# Patient Record
Sex: Female | Born: 1980 | Race: Black or African American | Hispanic: No | Marital: Single | Smoking: Current every day smoker
Health system: Southern US, Community
[De-identification: ages and names within clinical notes are randomized; demographics above are authoritative.]

## PROBLEM LIST (undated history)

## (undated) DIAGNOSIS — I10 Essential (primary) hypertension: Secondary | ICD-10-CM

## (undated) HISTORY — PX: OTHER SURGICAL HISTORY: SHX169

## (undated) HISTORY — DX: Essential (primary) hypertension: I10

---

## 2009-08-13 ENCOUNTER — Ambulatory Visit: Payer: Self-pay | Admitting: Family Medicine

## 2009-08-13 DIAGNOSIS — I10 Essential (primary) hypertension: Secondary | ICD-10-CM

## 2009-09-28 ENCOUNTER — Ambulatory Visit: Payer: Self-pay | Admitting: Family Medicine

## 2009-09-28 DIAGNOSIS — R000 Tachycardia, unspecified: Secondary | ICD-10-CM

## 2009-10-01 ENCOUNTER — Encounter: Payer: Self-pay | Admitting: Family Medicine

## 2009-10-05 LAB — CONVERTED CEMR LAB
ALT: 11 units/L (ref 0–35)
Albumin: 4.1 g/dL (ref 3.5–5.2)
CO2: 23 meq/L (ref 19–32)
Chloride: 105 meq/L (ref 96–112)
Cholesterol: 147 mg/dL (ref 0–200)
Potassium: 3.4 meq/L — ABNORMAL LOW (ref 3.5–5.3)
Sodium: 141 meq/L (ref 135–145)
Total Bilirubin: 0.6 mg/dL (ref 0.3–1.2)
Total Protein: 7.1 g/dL (ref 6.0–8.3)
VLDL: 19 mg/dL (ref 0–40)

## 2009-10-08 ENCOUNTER — Ambulatory Visit: Payer: Self-pay | Admitting: Family Medicine

## 2009-10-08 DIAGNOSIS — K5909 Other constipation: Secondary | ICD-10-CM | POA: Insufficient documentation

## 2009-10-08 DIAGNOSIS — K644 Residual hemorrhoidal skin tags: Secondary | ICD-10-CM | POA: Insufficient documentation

## 2010-01-28 ENCOUNTER — Ambulatory Visit: Payer: Self-pay | Admitting: Family Medicine

## 2010-08-13 ENCOUNTER — Ambulatory Visit: Payer: Self-pay | Admitting: Family Medicine

## 2010-08-13 DIAGNOSIS — J069 Acute upper respiratory infection, unspecified: Secondary | ICD-10-CM | POA: Insufficient documentation

## 2010-11-09 NOTE — Assessment & Plan Note (Signed)
Summary: f/u HTN   Vital Signs:  Patient profile:   30 year old female Height:      65 inches Weight:      147 pounds BMI:     24.55 O2 Sat:      100 % on Room air Pulse rate:   87 / minute BP sitting:   118 / 77  (left arm) Cuff size:   regular  Vitals Entered By: Payton Spark CMA (January 28, 2010 3:09 PM)  O2 Flow:  Room air CC: F/U BP. Doing well. , Hypertension Management   CC:  F/U BP. Doing well.  and Hypertension Management.  Hypertension History:      She denies headache, chest pain, palpitations, dyspnea with exertion, orthopnea, PND, peripheral edema, visual symptoms, neurologic problems, syncope, and side effects from treatment.  She notes no problems with any antihypertensive medication side effects.  Doing well on HCTZ 12.5mg / day.  Constipation has improved.  Marland Kitchen        Positive major cardiovascular risk factors include hypertension and current tobacco user.  Negative major cardiovascular risk factors include female age less than 64 years old, no history of diabetes or hyperlipidemia, and negative family history for ischemic heart disease.        Further assessment for target organ damage reveals no history of ASHD, cardiac end-organ damage (CHF/LVH), stroke/TIA, peripheral vascular disease, renal insufficiency, or hypertensive retinopathy.     Habits & Providers  Alcohol-Tobacco-Diet     Tobacco Status: current  Current Medications (verified): 1)  Depo-Provera 150 Mg/ml Susp (Medroxyprogesterone Acetate) .... Inj Q 3 Months. 2)  Hydrochlorothiazide 12.5 Mg Caps (Hydrochlorothiazide) .Marland Kitchen.. 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 Lives with 36 yo daughter.  Her mom helps out. Smokes 1 ppd x 4 yrs. Denies  ETOH. Walks 30 min daily. Not currently sexually active -- on Depo Provera.    Social History: Reviewed history from 08/13/2009 and no changes required. 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 Lives with 41 yo daughter.  Her mom helps out. Smokes 1 ppd x 4 yrs. Denies ETOH. Walks 30 min daily. Not currently sexually active -- on Depo Provera.  Smoking Status:  current  Review of Systems      See HPI  Physical Exam  General:  alert, well-developed, well-nourished, and well-hydrated.   Head:  normocephalic and atraumatic.   Eyes:  pupils equal, pupils round, and pupils reactive to light.  wears glasses Nose:  no nasal discharge.   Mouth:  good dentition and pharynx pink and moist.   Neck:  no masses.   Lungs:  Normal respiratory effort, chest expands symmetrically. Lungs are clear to auscultation, no crackles or wheezes. Heart:  Normal rate and regular rhythm. S1 and S2 normal without gallop, murmur, click, rub or other extra sounds. Extremities:  no LE edema Skin:  color normal.   Cervical Nodes:  No lymphadenopathy noted Psych:  good eye contact, not anxious appearing, and not depressed appearing.     Impression & Recommendations:  Problem # 1:  ESSENTIAL HYPERTENSION, BENIGN (ICD-401.1) BP looks great.  Continue current meds.  RFd.  Labs are UTD.  Her updated medication list for this problem includes:    Hydrochlorothiazide 12.5 Mg Caps (Hydrochlorothiazide) .Marland KitchenMarland KitchenMarland KitchenMarland Kitchen  1 capsule by mouth daily  BP today: 118/77 Prior BP: 93/63 (10/08/2009)  10 Yr Risk Heart Disease: 1 %  Labs Reviewed: K+: 3.4 (10/01/2009) Creat: : 1.10 (10/01/2009)   Chol: 147 (10/01/2009)   HDL: 43 (10/01/2009)   LDL: 85 (10/01/2009)   TG: 96 (10/01/2009)  Problem # 2:  CONSTIPATION, CHRONIC (ICD-564.09) Assessment: Improved Improved and hemorrhoid has also improved.  Continue maintenance with high fiber diet, plenty of water, exercise and stool softeners.   Complete Medication  List: 1)  Depo-provera 150 Mg/ml Susp (Medroxyprogesterone acetate) .... Inj q 3 months. 2)  Hydrochlorothiazide 12.5 Mg Caps (Hydrochlorothiazide) .Marland Kitchen.. 1 capsule by mouth daily  Hypertension Assessment/Plan:      The patient's hypertensive risk group is category B: At least one risk factor (excluding diabetes) with no target organ damage.  Her calculated 10 year risk of coronary heart disease is 1 %.  Today's blood pressure is 118/77.  Her blood pressure goal is < 140/90.  Patient Instructions: 1)  HCTZ RFd. 2)  BP is perfect. 3)  High fiber diet, plenty of water, exercise and OTC COLACE (stool softener). Prescriptions: HYDROCHLOROTHIAZIDE 12.5 MG CAPS (HYDROCHLOROTHIAZIDE) 1 capsule by mouth daily  #90 x 1   Entered and Authorized by:   Seymour Bars DO   Signed by:   Seymour Bars DO on 01/28/2010   Method used:   Electronically to        [facility] [phone]* (retail)       [address] [place], [place]  [address]       Ph: [phone] or [phone]       Fax: [phone]   RxID:   [id number]

## 2010-11-09 NOTE — Assessment & Plan Note (Signed)
Summary: cold/ HTN   Vital Signs:  Patient profile:   30 year old female Height:      65 inches Weight:      146 pounds BMI:     24.38 O2 Sat:      100 % on Room air Pulse rate:   97 / minute BP sitting:   119 / 71  (left arm) Cuff size:   regular  Vitals Entered By: Payton Spark CMA (August 13, 2010 1:37 PM)  O2 Flow:  Room air CC: F/U HTN. Also c/o cough and cold x 1 week.    Primary Care Provider:  Seymour Bars DO  CC:  F/U HTN. Also c/o cough and cold x 1 week. Marland Kitchen  History of Present Illness: 30 yo AAF presents for a cold that started 8 days ago.  She has a lingering dry cough.  Able to sleep okay.  using Tylenol cold and cough drops.  Sleeping ok.  She has not had asthma problems.  She has felt chest tightness and wheezing.  She is a smoker.  Doing well on HCTZ for HTN.    Current Medications (verified): 1)  Depo-Provera 150 Mg/ml Susp (Medroxyprogesterone Acetate) .... Inj Q 3 Months. 2)  Hydrochlorothiazide 12.5 Mg Caps (Hydrochlorothiazide) .Marland Kitchen.. 1 Capsule By Mouth Daily  Allergies (verified): No Known Drug Allergies  Past History:  Past Medical History: [id number] HTN  gyn: Dr Lily Peer  Social History: Reviewed history from 08/13/2009 and no changes required. 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  Her mom helps out. Smokes 1 ppd x 4 yrs. Denies ETOH. Walks 30 min daily. Not currently sexually active -- on Depo Provera.    Review of Systems      See HPI  Physical Exam  General:  alert, well-developed, well-nourished, and well-hydrated.   Head:  normocephalic and atraumatic.  sinuses NTTP Eyes:  conjunctiva clear Ears:  EACs patent; TMs translucent and gray with good cone of light and bony landmarks.  Nose:  no nasal discharge.   Mouth:  good dentition and pharynx pink and moist.   Neck:  no masses.   Lungs:  Normal respiratory effort, chest expands symmetrically. Lungs are clear to auscultation,  no crackles or wheezes. Heart:  Normal rate and regular rhythm. S1 and S2 normal without gallop, murmur, click, rub or other extra sounds. Skin:  color normal and no rashes.   Cervical Nodes:  No lymphadenopathy noted   Impression & Recommendations:  Problem # 1:  ESSENTIAL HYPERTENSION, BENIGN (ICD-401.1) Doing well.  Stay on current meds.  Labs UTD.  RTC in 6 mos. Her updated medication list for this problem includes:    Hydrochlorothiazide 12.5 Mg Caps (Hydrochlorothiazide) .Marland Kitchen... 1 capsule by mouth daily  BP today: 119/71 Prior BP: 118/77 (01/28/2010)  Prior 10 Yr Risk Heart Disease: 1 % (01/28/2010)  Labs Reviewed: K+: 3.4 (10/01/2009) Creat: : 1.10 (10/01/2009)   Chol: 147 (10/01/2009)   HDL: 43 (10/01/2009)   LDL: 85 (10/01/2009)   TG: 96 (10/01/2009)  Problem # 2:  VIRAL URI (ICD-465.9)  Her updated medication list for this problem includes:    Benzonatate 200 Mg Caps (Benzonatate) .Marland Kitchen... 1 capsule by mouth three times a day as needed cough  Instructed on symptomatic treatment. Call if symptoms persist or worsen.   Complete Medication List: 1)  Depo-provera 150 Mg/ml Susp (Medroxyprogesterone acetate) .... Inj q 3 months. 2)  Hydrochlorothiazide 12.5 Mg Caps (Hydrochlorothiazide) .Marland KitchenMarland KitchenMarland Kitchen  1 capsule by mouth daily 3)  Benzonatate 200 Mg Caps (Benzonatate) .Marland Kitchen.. 1 capsule by mouth three times a day as needed cough  Patient Instructions: 1)  Use Benzonate as needed for cough. 2)  Expect improvements in the next 7 days. 3)  Call if any problems. 4)  Avoid smoking. 5)  BP looks great! 6)  Stay on HCTZ daily. 7)  Return for f/u BP with fasting labs in 6 mos. Prescriptions: BENZONATATE 200 MG CAPS (BENZONATATE) 1 capsule by mouth three times a day as needed cough  #24 x 0   Entered and Authorized by:   Seymour Bars DO   Signed by:   Seymour Bars DO on 08/13/2010   Method used:   [facility]ronically to        [facility] [phone]* (retail)       [address] [place], [place]  [address]       Ph: [phone] or [phone]       Fax: [phone]   RxID:   [id number]    Orders Added: 1)  Est. Patient Level III [36644]

## 2011-01-10 ENCOUNTER — Other Ambulatory Visit: Payer: Self-pay | Admitting: Family Medicine

## 2011-01-11 ENCOUNTER — Other Ambulatory Visit: Payer: Self-pay | Admitting: *Deleted

## 2011-01-11 DIAGNOSIS — I10 Essential (primary) hypertension: Secondary | ICD-10-CM

## 2011-01-11 MED ORDER — HYDROCHLOROTHIAZIDE 12.5 MG PO CAPS: 12.5000 mg | ORAL_CAPSULE | Freq: Every day | ORAL | Status: DC

## 2011-02-03 ENCOUNTER — Ambulatory Visit (INDEPENDENT_AMBULATORY_CARE_PROVIDER_SITE_OTHER): Payer: BC Managed Care – PPO | Admitting: Family Medicine

## 2011-02-03 ENCOUNTER — Encounter: Payer: Self-pay | Admitting: Family Medicine

## 2011-02-03 VITALS — BP 121/86 | HR 101 | Temp 98.6°F | Ht 64.0 in | Wt 151.0 lb

## 2011-02-03 DIAGNOSIS — J329 Chronic sinusitis, unspecified: Secondary | ICD-10-CM

## 2011-02-03 MED ORDER — AMOXICILLIN-POT CLAVULANATE 875-125 MG PO TABS: 1.0000 | ORAL_TABLET | Freq: Two times a day (BID) | ORAL | Status: AC

## 2011-02-03 NOTE — Progress Notes (Signed)
  Subjective:    Patient ID: Brianna Mendoza, female    DOB: 31-Mar-1981, 31 y.o.   MRN: [id number]  HPI  30 yo AAF presents for for sinus congestion x 3 days.  She had dental surgery last wk.  She if feeling bad.  She has a lot of rhinorrhea.  No known allergies.  She does sneeze a lot.  She has a lot of sinus pain/ pressure.  No fevers or chills.  She is taking Amoxicillin (on for the past 7 days) for recent wisdom teeth removal.    BP 121/86  Pulse 101  Temp(Src) 98.6 F (37 C) (Oral)  Ht 5\' 4"  (1.626 m)  Wt 151 lb (68.493 kg)  BMI 25.92 kg/m2  SpO2 98%      Review of Systems as per HPI    Objective:   Physical Exam  Constitutional: She appears well-developed and well-nourished. No distress.       Maxillary sinuses TTP  HENT:  Head: Normocephalic and atraumatic.  Right Ear: There is tenderness. Tympanic membrane is not injected and not erythematous. A middle ear effusion is present.  Left Ear: There is tenderness. Tympanic membrane is not injected and not erythematous. A middle ear effusion is present.  Mouth/Throat: No oropharyngeal exudate.  Eyes: Conjunctivae are normal.  Cardiovascular: Normal rate, regular rhythm and normal heart sounds.   Pulmonary/Chest: Effort normal and breath sounds normal. No respiratory distress.  Lymphadenopathy:    She has cervical adenopathy.  Skin: Skin is warm and dry.  Psychiatric: She has a normal mood and affect.          Assessment & Plan:  1> sinusitis - Amox for dental work does not seem to be helping.  Changed her to Augmentin (can stop the Amox) this will also cover for dental issues.  She can use Advil cold and sinus for congestion/ ear effusions.  OIut of work note given for today.  Rest, clear fluids and call if not resolved in 10 days.

## 2011-02-03 NOTE — Patient Instructions (Signed)
Trade Amoxicillin for Augmentin and take with breakfast and dinner for the next 7 days. Take Advil Cold and Sinus for symptom relief and try to rest and hydrate.  Call if sinusitis has not resolved in 7 days.  F/U with your oral surgeon also.

## 2011-02-11 ENCOUNTER — Ambulatory Visit: Payer: Self-pay | Admitting: Family Medicine

## 2011-03-19 ENCOUNTER — Other Ambulatory Visit: Payer: Self-pay | Admitting: Family Medicine

## 2011-07-25 ENCOUNTER — Other Ambulatory Visit: Payer: Self-pay | Admitting: *Deleted

## 2011-07-25 DIAGNOSIS — I10 Essential (primary) hypertension: Secondary | ICD-10-CM

## 2011-07-25 MED ORDER — HYDROCHLOROTHIAZIDE 12.5 MG PO CAPS: 12.5000 mg | ORAL_CAPSULE | Freq: Every day | ORAL | Status: DC

## 2011-08-07 ENCOUNTER — Encounter: Payer: Self-pay | Admitting: Family Medicine

## 2011-08-12 ENCOUNTER — Ambulatory Visit: Payer: BC Managed Care – PPO | Admitting: Family Medicine

## 2011-08-12 DIAGNOSIS — Z0289 Encounter for other administrative examinations: Secondary | ICD-10-CM

## 2011-09-26 ENCOUNTER — Other Ambulatory Visit: Payer: Self-pay | Admitting: *Deleted

## 2011-09-26 DIAGNOSIS — I10 Essential (primary) hypertension: Secondary | ICD-10-CM

## 2011-09-26 MED ORDER — HYDROCHLOROTHIAZIDE 12.5 MG PO CAPS: 12.5000 mg | ORAL_CAPSULE | Freq: Every day | ORAL | Status: DC

## 2011-11-25 ENCOUNTER — Other Ambulatory Visit: Payer: Self-pay | Admitting: Family Medicine

## 2011-11-28 NOTE — Telephone Encounter (Signed)
Must make appointment 

## 2011-12-19 DIAGNOSIS — R102 Pelvic and perineal pain unspecified side: Secondary | ICD-10-CM | POA: Insufficient documentation

## 2011-12-19 DIAGNOSIS — N9489 Other specified conditions associated with female genital organs and menstrual cycle: Secondary | ICD-10-CM | POA: Insufficient documentation

## 2011-12-31 ENCOUNTER — Other Ambulatory Visit: Payer: Self-pay | Admitting: Family Medicine

## 2012-01-02 NOTE — Telephone Encounter (Signed)
Must make appointment 

## 2012-01-30 ENCOUNTER — Other Ambulatory Visit: Payer: Self-pay | Admitting: Family Medicine

## 2012-03-01 ENCOUNTER — Other Ambulatory Visit: Payer: Self-pay | Admitting: Family Medicine

## 2012-04-05 ENCOUNTER — Other Ambulatory Visit: Payer: Self-pay | Admitting: Family Medicine

## 2012-04-06 ENCOUNTER — Other Ambulatory Visit: Payer: Self-pay | Admitting: Family Medicine

## 2012-04-11 ENCOUNTER — Encounter: Payer: Self-pay | Admitting: Family Medicine

## 2012-04-11 ENCOUNTER — Ambulatory Visit (INDEPENDENT_AMBULATORY_CARE_PROVIDER_SITE_OTHER): Payer: BC Managed Care – PPO | Admitting: Family Medicine

## 2012-04-11 VITALS — BP 123/81 | HR 85 | Ht 64.0 in | Wt 153.0 lb

## 2012-04-11 DIAGNOSIS — I10 Essential (primary) hypertension: Secondary | ICD-10-CM

## 2012-04-11 LAB — LIPID PANEL
HDL: 39 mg/dL — ABNORMAL LOW (ref >39)
LDL Cholesterol: 84 mg/dL (ref 0–99)
Triglycerides: 64 mg/dL (ref <150)
VLDL: 13 mg/dL (ref 0–40)

## 2012-04-11 MED ORDER — HYDROCHLOROTHIAZIDE 12.5 MG PO CAPS: 12.5000 mg | ORAL_CAPSULE | Freq: Every day | ORAL | Status: DC

## 2012-04-11 NOTE — Progress Notes (Signed)
  Subjective:    Patient ID: Brianna Mendoza, female    DOB: 1981-02-15, 31 y.o.   MRN: [id number]  HPI HTN- No CP or SOB.  Has been exercising more and eating healthy.  Says would like to be able to get off BP meds.  Says plans on getting her own machine.  Eating low salt chips.  Tolerating meds well.    Review of Systems     Objective:   Physical Exam  Constitutional: She is oriented to person, place, and time. She appears well-developed and well-nourished.  HENT:  Head: Normocephalic and atraumatic.  Cardiovascular: Normal rate, regular rhythm and normal heart sounds.   Pulmonary/Chest: Effort normal and breath sounds normal.  Neurological: She is alert and oriented to person, place, and time.  Skin: Skin is warm and dry.  Psychiatric: She has a normal mood and affect. Her behavior is normal.          Assessment & Plan:  HTN - Well controlled. F/U in 6 months Due for CMP and Lipids. She will go today.

## 2012-04-12 LAB — COMPLETE METABOLIC PANEL WITH GFR
ALT: 11 U/L (ref 0–35)
AST: 14 U/L (ref 0–37)
CO2: 29 mEq/L (ref 19–32)
Calcium: 9.4 mg/dL (ref 8.4–10.5)
Chloride: 103 mEq/L (ref 96–112)
Creat: 1.09 mg/dL (ref 0.50–1.10)
GFR, Est African American: 78 mL/min
Sodium: 141 mEq/L (ref 135–145)
Total Protein: 6.8 g/dL (ref 6.0–8.3)

## 2012-09-14 ENCOUNTER — Ambulatory Visit: Payer: BC Managed Care – PPO | Admitting: Family Medicine

## 2012-11-23 ENCOUNTER — Encounter: Payer: Self-pay | Admitting: Physician Assistant

## 2012-11-23 ENCOUNTER — Ambulatory Visit (INDEPENDENT_AMBULATORY_CARE_PROVIDER_SITE_OTHER): Payer: Self-pay

## 2012-11-23 ENCOUNTER — Ambulatory Visit (INDEPENDENT_AMBULATORY_CARE_PROVIDER_SITE_OTHER): Payer: Self-pay | Admitting: Physician Assistant

## 2012-11-23 VITALS — BP 131/85 | HR 76 | Wt 155.0 lb

## 2012-11-23 DIAGNOSIS — M545 Low back pain: Secondary | ICD-10-CM

## 2012-11-23 DIAGNOSIS — Z9181 History of falling: Secondary | ICD-10-CM

## 2012-11-23 DIAGNOSIS — W19XXXA Unspecified fall, initial encounter: Secondary | ICD-10-CM

## 2012-11-23 DIAGNOSIS — M25551 Pain in right hip: Secondary | ICD-10-CM

## 2012-11-23 DIAGNOSIS — M25559 Pain in unspecified hip: Secondary | ICD-10-CM

## 2012-11-23 DIAGNOSIS — W010XXA Fall on same level from slipping, tripping and stumbling without subsequent striking against object, initial encounter: Secondary | ICD-10-CM

## 2012-11-23 MED ORDER — HYDROCODONE-ACETAMINOPHEN 5-325 MG PO TABS: 1.0000 | ORAL_TABLET | Freq: Four times a day (QID) | ORAL | Status: DC | PRN

## 2012-11-23 MED ORDER — CYCLOBENZAPRINE HCL 10 MG PO TABS: 10.0000 mg | ORAL_TABLET | Freq: Every evening | ORAL | Status: DC | PRN

## 2012-11-23 NOTE — Patient Instructions (Addendum)
Can take up to 800mg  of ibuprofen up to three times a day if needed.   RICE: Routine Care for Injuries The routine care of many injuries includes Rest, Ice, Compression, and Elevation (RICE). HOME CARE INSTRUCTIONS  Rest is needed to allow your body to heal. Routine activities can usually be resumed when comfortable. Injured tendons and bones can take up to 6 weeks to heal. Tendons are the cord-like structures that attach muscle to bone.  Ice following an injury helps keep the swelling down and reduces pain.  Put ice in a plastic bag.  Place a towel between your skin and the bag.  Leave the ice on for 15 to 20 minutes, 3 to 4 times a day. Do this while awake, for the first 24 to 48 hours. After that, continue as directed by your caregiver.  Compression helps keep swelling down. It also gives support and helps with discomfort. If an elastic bandage has been applied, it should be removed and reapplied every 3 to 4 hours. It should not be applied tightly, but firmly enough to keep swelling down. Watch fingers or toes for swelling, bluish discoloration, coldness, numbness, or excessive pain. If any of these problems occur, remove the bandage and reapply loosely. Contact your caregiver if these problems continue.  Elevation helps reduce swelling and decreases pain. With extremities, such as the arms, hands, legs, and feet, the injured area should be placed near or above the level of the heart, if possible. SEEK IMMEDIATE MEDICAL CARE IF:  You have persistent pain and swelling.  You develop redness, numbness, or unexpected weakness.  Your symptoms are getting worse rather than improving after several days. These symptoms may indicate that further evaluation or further X-rays are needed. Sometimes, X-rays may not show a small broken bone (fracture) until 1 week or 10 days later. Make a follow-up appointment with your caregiver. Ask when your X-ray results will be ready. Make sure you get your  X-ray results. Document Released: 01/08/2001 Document Revised: 12/19/2011 Document Reviewed: 02/25/2011 [facility] Patient Information 2013 [place], [place]. Low Back Sprain with Rehab  A sprain is an injury in which a ligament is torn. The ligaments of the lower back are vulnerable to sprains. However, they are strong and require great force to be injured. These ligaments are important for stabilizing the spinal column. Sprains are classified into three categories. Grade 1 sprains cause pain, but the tendon is not lengthened. Grade 2 sprains include a lengthened ligament, due to the ligament being stretched or partially ruptured. With grade 2 sprains there is still function, although the function may be decreased. Grade 3 sprains involve a complete tear of the tendon or muscle, and function is usually impaired. SYMPTOMS   Severe pain in the lower back.  Sometimes, a feeling of a "pop," "snap," or tear, at the time of injury.  Tenderness and sometimes swelling at the injury site.  Uncommonly, bruising (contusion) within 48 hours of injury.  Muscle spasms in the back. CAUSES  Low back sprains occur when a force is placed on the ligaments that is greater than they can handle. Common causes of injury include:  Performing a stressful act while off-balance.  Repetitive stressful activities that involve movement of the lower back.  Direct hit (trauma) to the lower back. RISK INCREASES WITH:  Contact sports (football, wrestling).  Collisions (major skiing accidents).  Sports that require throwing or lifting (baseball, weightlifting).  Sports involving twisting of the spine (gymnastics, diving, tennis, golf).  Poor strength  and flexibility.  Inadequate protection.  Previous back injury or surgery (especially fusion). PREVENTION  Wear properly fitted and padded protective equipment.  Warm up and stretch properly before activity.  Allow for adequate recovery between  workouts.  Maintain physical fitness:  Strength, flexibility, and endurance.  Cardiovascular fitness.  Maintain a healthy body weight. PROGNOSIS  If treated properly, low back sprains usually heal with non-surgical treatment. The length of time for healing depends on the severity of the injury.  RELATED COMPLICATIONS   Recurring symptoms, resulting in a chronic problem.  Chronic inflammation and pain in the low back.  Delayed healing or resolution of symptoms, especially if activity is resumed too soon.  Prolonged impairment.  Unstable or arthritic joints of the low back. TREATMENT  Treatment first involves the use of ice and medicine, to reduce pain and inflammation. The use of strengthening and stretching exercises may help reduce pain with activity. These exercises may be performed at home or with a therapist. Severe injuries may require referral to a therapist for further evaluation and treatment, such as ultrasound. Your caregiver may advise that you wear a back brace or corset, to help reduce pain and discomfort. Often, prolonged bed rest results in greater harm then benefit. Corticosteroid injections may be recommended. However, these should be reserved for the most serious cases. It is important to avoid using your back when lifting objects. At night, sleep on your back on a firm mattress, with a pillow placed under your knees. If non-surgical treatment is unsuccessful, surgery may be needed.  MEDICATION   If pain medicine is needed, nonsteroidal anti-inflammatory medicines (aspirin and ibuprofen), or other minor pain relievers (acetaminophen), are often advised.  Do not take pain medicine for 7 days before surgery.  Prescription pain relievers may be given, if your caregiver thinks they are needed. Use only as directed and only as much as you need.  Ointments applied to the skin may be helpful.  Corticosteroid injections may be given by your caregiver. These injections  should be reserved for the most serious cases, because they may only be given a certain number of times. HEAT AND COLD  Cold treatment (icing) should be applied for 10 to 15 minutes every 2 to 3 hours for inflammation and pain, and immediately after activity that aggravates your symptoms. Use ice packs or an ice massage.  Heat treatment may be used before performing stretching and strengthening activities prescribed by your caregiver, physical therapist, or athletic trainer. Use a heat pack or a warm water soak. SEEK MEDICAL CARE IF:   Symptoms get worse or do not improve in 2 to 4 weeks, despite treatment.  You develop numbness or weakness in either leg.  You lose bowel or bladder function.  Any of the following occur after surgery: fever, increased pain, swelling, redness, drainage of fluids, or bleeding in the affected area.  New, unexplained symptoms develop. (Drugs used in treatment may produce side effects.) EXERCISES  RANGE OF MOTION (ROM) AND STRETCHING EXERCISES - Low Back Sprain Most people with lower back pain will find that their symptoms get worse with excessive bending forward (flexion) or arching at the lower back (extension). The exercises that will help resolve your symptoms will focus on the opposite motion.  Your physician, physical therapist or athletic trainer will help you determine which exercises will be most helpful to resolve your lower back pain. Do not complete any exercises without first consulting with your caregiver. Discontinue any exercises which make your symptoms worse,  until you speak to your caregiver. If you have pain, numbness or tingling which travels down into your buttocks, leg or foot, the goal of the therapy is for these symptoms to move closer to your back and eventually resolve. Sometimes, these leg symptoms will get better, but your lower back pain may worsen. This is often an indication of progress in your rehabilitation. Be very alert to any  changes in your symptoms and the activities in which you participated in the 24 hours prior to the change. Sharing this information with your caregiver will allow him or her to most efficiently treat your condition. These exercises may help you when beginning to rehabilitate your injury. Your symptoms may resolve with or without further involvement from your physician, physical therapist or athletic trainer. While completing these exercises, remember:   Restoring tissue flexibility helps normal motion to return to the joints. This allows healthier, less painful movement and activity.  An effective stretch should be held for at least 30 seconds.  A stretch should never be painful. You should only feel a gentle lengthening or release in the stretched tissue. FLEXION RANGE OF MOTION AND STRETCHING EXERCISES: STRETCH  Flexion, Single Knee to Chest   Lie on a firm bed or floor with both legs extended in front of you.  Keeping one leg in contact with the floor, bring your opposite knee to your chest. Hold your leg in place by either grabbing behind your thigh or at your knee.  Pull until you feel a gentle stretch in your low back. Hold __________ seconds.  Slowly release your grasp and repeat the exercise with the opposite side. Repeat __________ times. Complete this exercise __________ times per day.  STRETCH  Flexion, Double Knee to Chest  Lie on a firm bed or floor with both legs extended in front of you.  Keeping one leg in contact with the floor, bring your opposite knee to your chest.  Tense your stomach muscles to support your back and then lift your other knee to your chest. Hold your legs in place by either grabbing behind your thighs or at your knees.  Pull both knees toward your chest until you feel a gentle stretch in your low back. Hold __________ seconds.  Tense your stomach muscles and slowly return one leg at a time to the floor. Repeat __________ times. Complete this exercise  __________ times per day.  STRETCH  Low Trunk Rotation  Lie on a firm bed or floor. Keeping your legs in front of you, bend your knees so they are both pointed toward the ceiling and your feet are flat on the floor.  Extend your arms out to the side. This will stabilize your upper body by keeping your shoulders in contact with the floor.  Gently and slowly drop both knees together to one side until you feel a gentle stretch in your low back. Hold for __________ seconds.  Tense your stomach muscles to support your lower back as you bring your knees back to the starting position. Repeat the exercise to the other side. Repeat __________ times. Complete this exercise __________ times per day  EXTENSION RANGE OF MOTION AND FLEXIBILITY EXERCISES: STRETCH  Extension, Prone on Elbows   Lie on your stomach on the floor, a bed will be too soft. Place your palms about shoulder width apart and at the height of your head.  Place your elbows under your shoulders. If this is too painful, stack pillows under your chest.  Allow your  body to relax so that your hips drop lower and make contact more completely with the floor.  Hold this position for __________ seconds.  Slowly return to lying flat on the floor. Repeat __________ times. Complete this exercise __________ times per day.  RANGE OF MOTION  Extension, Prone Press Ups  Lie on your stomach on the floor, a bed will be too soft. Place your palms about shoulder width apart and at the height of your head.  Keeping your back as relaxed as possible, slowly straighten your elbows while keeping your hips on the floor. You may adjust the placement of your hands to maximize your comfort. As you gain motion, your hands will come more underneath your shoulders.  Hold this position __________ seconds.  Slowly return to lying flat on the floor. Repeat __________ times. Complete this exercise __________ times per day.  RANGE OF MOTION- Quadruped, Neutral  Spine   Assume a hands and knees position on a firm surface. Keep your hands under your shoulders and your knees under your hips. You may place padding under your knees for comfort.  Drop your head and point your tailbone toward the ground below you. This will round out your lower back like an angry cat. Hold this position for __________ seconds.  Slowly lift your head and release your tail bone so that your back sags into a large arch, like an old horse.  Hold this position for __________ seconds.  Repeat this until you feel limber in your low back.  Now, find your "sweet spot." This will be the most comfortable position somewhere between the two previous positions. This is your neutral spine. Once you have found this position, tense your stomach muscles to support your low back.  Hold this position for __________ seconds. Repeat __________ times. Complete this exercise __________ times per day.  STRENGTHENING EXERCISES - Low Back Sprain These exercises may help you when beginning to rehabilitate your injury. These exercises should be done near your "sweet spot." This is the neutral, low-back arch, somewhere between fully rounded and fully arched, that is your least painful position. When performed in this safe range of motion, these exercises can be used for people who have either a flexion or extension based injury. These exercises may resolve your symptoms with or without further involvement from your physician, physical therapist or athletic trainer. While completing these exercises, remember:   Muscles can gain both the endurance and the strength needed for everyday activities through controlled exercises.  Complete these exercises as instructed by your physician, physical therapist or athletic trainer. Increase the resistance and repetitions only as guided.  You may experience muscle soreness or fatigue, but the pain or discomfort you are trying to eliminate should never worsen during  these exercises. If this pain does worsen, stop and make certain you are following the directions exactly. If the pain is still present after adjustments, discontinue the exercise until you can discuss the trouble with your caregiver. STRENGTHENING Deep Abdominals, Pelvic Tilt   Lie on a firm bed or floor. Keeping your legs in front of you, bend your knees so they are both pointed toward the ceiling and your feet are flat on the floor.  Tense your lower abdominal muscles to press your low back into the floor. This motion will rotate your pelvis so that your tail bone is scooping upwards rather than pointing at your feet or into the floor. With a gentle tension and even breathing, hold this position for __________  seconds. Repeat __________ times. Complete this exercise __________ times per day.  STRENGTHENING  Abdominals, Crunches   Lie on a firm bed or floor. Keeping your legs in front of you, bend your knees so they are both pointed toward the ceiling and your feet are flat on the floor. Cross your arms over your chest.  Slightly tip your chin down without bending your neck.  Tense your abdominals and slowly lift your trunk high enough to just clear your shoulder blades. Lifting higher can put excessive stress on the lower back and does not further strengthen your abdominal muscles.  Control your return to the starting position. Repeat __________ times. Complete this exercise __________ times per day.  STRENGTHENING  Quadruped, Opposite UE/LE Lift   Assume a hands and knees position on a firm surface. Keep your hands under your shoulders and your knees under your hips. You may place padding under your knees for comfort.  Find your neutral spine and gently tense your abdominal muscles so that you can maintain this position. Your shoulders and hips should form a rectangle that is parallel with the floor and is not twisted.  Keeping your trunk steady, lift your right hand no higher than your  shoulder and then your left leg no higher than your hip. Make sure you are not holding your breath. Hold this position for __________ seconds.  Continuing to keep your abdominal muscles tense and your back steady, slowly return to your starting position. Repeat with the opposite arm and leg. Repeat __________ times. Complete this exercise __________ times per day.  STRENGTHENING  Abdominals and Quadriceps, Straight Leg Raise   Lie on a firm bed or floor with both legs extended in front of you.  Keeping one leg in contact with the floor, bend the other knee so that your foot can rest flat on the floor.  Find your neutral spine, and tense your abdominal muscles to maintain your spinal position throughout the exercise.  Slowly lift your straight leg off the floor about 6 inches for a count of 15, making sure to not hold your breath.  Still keeping your neutral spine, slowly lower your leg all the way to the floor. Repeat this exercise with each leg __________ times. Complete this exercise __________ times per day. POSTURE AND BODY MECHANICS CONSIDERATIONS - Low Back Sprain Keeping correct posture when sitting, standing or completing your activities will reduce the stress put on different body tissues, allowing injured tissues a chance to heal and limiting painful experiences. The following are general guidelines for improved posture. Your physician or physical therapist will provide you with any instructions specific to your needs. While reading these guidelines, remember:  The exercises prescribed by your provider will help you have the flexibility and strength to maintain correct postures.  The correct posture provides the best environment for your joints to work. All of your joints have less wear and tear when properly supported by a spine with good posture. This means you will experience a healthier, less painful body.  Correct posture must be practiced with all of your activities, especially  prolonged sitting and standing. Correct posture is as important when doing repetitive low-stress activities (typing) as it is when doing a single heavy-load activity (lifting). RESTING POSITIONS Consider which positions are most painful for you when choosing a resting position. If you have pain with flexion-based activities (sitting, bending, stooping, squatting), choose a position that allows you to rest in a less flexed posture. You would want to  avoid curling into a fetal position on your side. If your pain worsens with extension-based activities (prolonged standing, working overhead), avoid resting in an extended position such as sleeping on your stomach. Most people will find more comfort when they rest with their spine in a more neutral position, neither too rounded nor too arched. Lying on a non-sagging bed on your side with a pillow between your knees, or on your back with a pillow under your knees will often provide some relief. Keep in mind, being in any one position for a prolonged period of time, no matter how correct your posture, can still lead to stiffness. PROPER SITTING POSTURE In order to minimize stress and discomfort on your spine, you must sit with correct posture. Sitting with good posture should be effortless for a healthy body. Returning to good posture is a gradual process. Many people can work toward this most comfortably by using various supports until they have the flexibility and strength to maintain this posture on their own. When sitting with proper posture, your ears will fall over your shoulders and your shoulders will fall over your hips. You should use the back of the chair to support your upper back. Your lower back will be in a neutral position, just slightly arched. You may place a small pillow or folded towel at the base of your lower back for  support.  When working at a desk, create an environment that supports good, upright posture. Without extra support, muscles  tire, which leads to excessive strain on joints and other tissues. Keep these recommendations in mind: CHAIR:  A chair should be able to slide under your desk when your back makes contact with the back of the chair. This allows you to work closely.  The chair's height should allow your eyes to be level with the upper part of your monitor and your hands to be slightly lower than your elbows. BODY POSITION  Your feet should make contact with the floor. If this is not possible, use a foot rest.  Keep your ears over your shoulders. This will reduce stress on your neck and low back. INCORRECT SITTING POSTURES  If you are feeling tired and unable to assume a healthy sitting posture, do not slouch or slump. This puts excessive strain on your back tissues, causing more damage and pain. Healthier options include:  Using more support, like a lumbar pillow.  Switching tasks to something that requires you to be upright or walking.  Talking a brief walk.  Lying down to rest in a neutral-spine position. PROLONGED STANDING WHILE SLIGHTLY LEANING FORWARD  When completing a task that requires you to lean forward while standing in one place for a long time, place either foot up on a stationary 2-4 inch high object to help maintain the best posture. When both feet are on the ground, the lower back tends to lose its slight inward curve. If this curve flattens (or becomes too large), then the back and your other joints will experience too much stress, tire more quickly, and can cause pain. CORRECT STANDING POSTURES Proper standing posture should be assumed with all daily activities, even if they only take a few moments, like when brushing your teeth. As in sitting, your ears should fall over your shoulders and your shoulders should fall over your hips. You should keep a slight tension in your abdominal muscles to brace your spine. Your tailbone should point down to the ground, not behind your body, resulting in  an  over-extended swayback posture.  INCORRECT STANDING POSTURES  Common incorrect standing postures include a forward head, locked knees and/or an excessive swayback. WALKING Walk with an upright posture. Your ears, shoulders and hips should all line-up. PROLONGED ACTIVITY IN A FLEXED POSITION When completing a task that requires you to bend forward at your waist or lean over a low surface, try to find a way to stabilize 3 out of 4 of your limbs. You can place a hand or elbow on your thigh or rest a knee on the surface you are reaching across. This will provide you more stability, so that your muscles do not tire as quickly. By keeping your knees relaxed, or slightly bent, you will also reduce stress across your lower back. CORRECT LIFTING TECHNIQUES DO :  Assume a wide stance. This will provide you more stability and the opportunity to get as close as possible to the object which you are lifting.  Tense your abdominals to brace your spine. Bend at the knees and hips. Keeping your back locked in a neutral-spine position, lift using your leg muscles. Lift with your legs, keeping your back straight.  Test the weight of unknown objects before attempting to lift them.  Try to keep your elbows locked down at your sides in order get the best strength from your shoulders when carrying an object.  Always ask for help when lifting heavy or awkward objects. INCORRECT LIFTING TECHNIQUES DO NOT:   Lock your knees when lifting, even if it is a small object.  Bend and twist. Pivot at your feet or move your feet when needing to change directions.  Assume that you can safely pick up even a paperclip without proper posture. Document Released: 09/26/2005 Document Revised: 12/19/2011 Document Reviewed: 01/08/2009 [facility] Patient Information 2013 [place], [place].

## 2012-11-23 NOTE — Progress Notes (Signed)
  Subjective:    Patient ID: Brianna Mendoza, female    DOB: [birth year], 32 y.o.   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 They had not cleared snow/ice and she slipped and fell on right hip. Denies any past trauma or history of back pain. She did fall in ice on her left leg last year and fracture her left leg. She reports pain in her hip and back are 1/10 when sitting but 9/10 with movement. She has not taken anything or done anything to make better or worse. Denies any numbness or tingling down leg. Pt denies any head trauma.      Review of Systems     Objective:   Physical Exam  Constitutional: She appears well-developed and well-nourished.  HENT:  Head: Normocephalic and atraumatic.  Musculoskeletal:  ROM at waist decreased with side to side, extension, and flexion due to pain. Pain with palpation over lower spine and over right outer hip and thigh and right sided paraspinus muscles. NO bruising. No right knee pain. Passive ROM of right hip normal with some discomfort.   Skin: Skin is dry.  Psychiatric: She has a normal mood and affect. Her behavior is normal.          Assessment & Plan:  Fall/right hip injury/lower back pain- Gave a few vicodin for pain relief to use as needed. Increase ibuprofen to up to 800mg  up to TID for pain/inflammation. Take warm bath in epson salt. Ice as needed. Gave flexeril to use at bedtime. Give time to heal. Declined shot of toradol today. Will get xrays of right hip and lumbar spine since hx of fracture with fall. Will call with results.

## 2012-12-18 DIAGNOSIS — H5210 Myopia, unspecified eye: Secondary | ICD-10-CM | POA: Insufficient documentation

## 2013-01-07 ENCOUNTER — Other Ambulatory Visit: Payer: Self-pay | Admitting: Family Medicine

## 2013-01-20 ENCOUNTER — Other Ambulatory Visit: Payer: Self-pay | Admitting: Family Medicine

## 2013-11-09 ENCOUNTER — Other Ambulatory Visit: Payer: Self-pay | Admitting: Family Medicine

## 2013-11-11 NOTE — Telephone Encounter (Signed)
Called patient and notified we would refill for #30 and she needs to schedule appointment before any further refillls will be given

## 2013-12-13 ENCOUNTER — Encounter: Payer: Self-pay | Admitting: Family Medicine

## 2013-12-13 ENCOUNTER — Ambulatory Visit (INDEPENDENT_AMBULATORY_CARE_PROVIDER_SITE_OTHER): Payer: BC Managed Care – PPO

## 2013-12-13 ENCOUNTER — Ambulatory Visit (INDEPENDENT_AMBULATORY_CARE_PROVIDER_SITE_OTHER): Payer: BC Managed Care – PPO | Admitting: Family Medicine

## 2013-12-13 VITALS — BP 110/71 | HR 105 | Temp 98.1°F | Ht 65.0 in | Wt 157.0 lb

## 2013-12-13 DIAGNOSIS — M25562 Pain in left knee: Secondary | ICD-10-CM

## 2013-12-13 DIAGNOSIS — M25569 Pain in unspecified knee: Secondary | ICD-10-CM

## 2013-12-13 DIAGNOSIS — I10 Essential (primary) hypertension: Secondary | ICD-10-CM

## 2013-12-13 MED ORDER — HYDROCHLOROTHIAZIDE 12.5 MG PO CAPS: ORAL_CAPSULE | ORAL | Status: DC

## 2013-12-13 NOTE — Progress Notes (Signed)
   Subjective:    Patient ID: Brianna Mendoza, female    DOB: 05-Jan-1981, 33 y.o.   MRN: [id number]  HPI L leg hx of Fx 2013, pt fell on same leg last year. she reports that the pain comes and goes. gets worse when its cold. On average her pain is about a 5/10.  Says feels like it pain radiates from her knee down to her foot.  Says will feel ike the area gets numb and tingling in the last few months.  She is on depo-provera for birth control. She denies any hip or back pain. Though she did fall last year on her hip and had a normal x-ray of the lumbar spine and hip. No worsening or alleviating factors. She's not really using any medication for it.  Hypertension- Pt denies chest pain, SOB, dizziness, or heart palpitations.  Taking meds as directed w/o problems.  Denies medication side effects.     Review of Systems     Objective:   Physical Exam  Constitutional: She is oriented to person, place, and time. She appears well-developed and well-nourished.  HENT:  Head: Normocephalic and atraumatic.  Cardiovascular: Normal rate, regular rhythm and normal heart sounds.   Pulmonary/Chest: Effort normal and breath sounds normal.  Musculoskeletal:  Left knee with normal flexion, extension.  Strength is 5/5 at the hip, knee and ankle.  NROM of the ankle as well.  nontender over the knee or leg or ankle.    Neurological: She is alert and oriented to person, place, and time.  Skin: Skin is warm and dry.  Psychiatric: She has a normal mood and affect. Her behavior is normal.          Assessment & Plan:  HTN - well controlled. conitnue current regimen. F/U in 6 months.  Refill sent to pharmacy.  Left knee pain-fairly normal exam. I was unable to perform a McMurray's. Patient kept resisting the motions. She does have a prior history of her fracture and never followed back up with like to start with a plain film x-ray. If this is fairly normal showing good healing and union then recommend MRI for  further evaluation since her pain has been persistent for most 2 years. She's now getting numbness into and from the knee down to her foot. This started in the most recent months and is what concerns her the most. She denies any hip or back pain. She can certainly use an anti-inflammatory as needed for pain.

## 2013-12-16 ENCOUNTER — Telehealth: Payer: Self-pay | Admitting: *Deleted

## 2013-12-16 DIAGNOSIS — M25562 Pain in left knee: Secondary | ICD-10-CM

## 2013-12-16 NOTE — Telephone Encounter (Signed)
MRI ordered

## 2013-12-16 NOTE — Telephone Encounter (Signed)
Pt would like to move forward w/mri.Brianna Mendoza, Brianna Mendoza

## 2013-12-17 ENCOUNTER — Ambulatory Visit (INDEPENDENT_AMBULATORY_CARE_PROVIDER_SITE_OTHER): Payer: BC Managed Care – PPO

## 2013-12-17 ENCOUNTER — Telehealth: Payer: Self-pay | Admitting: *Deleted

## 2013-12-17 DIAGNOSIS — M25569 Pain in unspecified knee: Secondary | ICD-10-CM

## 2013-12-17 DIAGNOSIS — M25562 Pain in left knee: Secondary | ICD-10-CM

## 2013-12-17 NOTE — Telephone Encounter (Signed)
Prior auth obtained for MRI left knee without contrast.  Auth # [id number] good from 12/17/13 to 01/15/14.  [facility] notified. Barry DienesKimberly Gordon, LPN

## 2013-12-18 ENCOUNTER — Ambulatory Visit (INDEPENDENT_AMBULATORY_CARE_PROVIDER_SITE_OTHER): Payer: BC Managed Care – PPO | Admitting: Physician Assistant

## 2013-12-18 ENCOUNTER — Encounter: Payer: Self-pay | Admitting: Physician Assistant

## 2013-12-18 ENCOUNTER — Telehealth: Payer: Self-pay | Admitting: *Deleted

## 2013-12-18 VITALS — BP 129/71 | HR 100 | Wt 155.0 lb

## 2013-12-18 DIAGNOSIS — R936 Abnormal findings on diagnostic imaging of limbs: Secondary | ICD-10-CM | POA: Insufficient documentation

## 2013-12-18 DIAGNOSIS — M25569 Pain in unspecified knee: Secondary | ICD-10-CM

## 2013-12-18 DIAGNOSIS — R937 Abnormal findings on diagnostic imaging of other parts of musculoskeletal system: Secondary | ICD-10-CM

## 2013-12-18 DIAGNOSIS — M25579 Pain in unspecified ankle and joints of unspecified foot: Secondary | ICD-10-CM

## 2013-12-18 DIAGNOSIS — M25562 Pain in left knee: Secondary | ICD-10-CM

## 2013-12-18 DIAGNOSIS — M25572 Pain in left ankle and joints of left foot: Secondary | ICD-10-CM

## 2013-12-18 MED ORDER — TRAMADOL HCL 50 MG PO TABS: 50.0000 mg | ORAL_TABLET | Freq: Four times a day (QID) | ORAL | Status: DC | PRN

## 2013-12-18 NOTE — Patient Instructions (Signed)
Continue with ibuprofen/aleve as needed. Tramadol for break through pain.

## 2013-12-18 NOTE — Progress Notes (Signed)
   Subjective:    Patient ID: Brianna Mendoza, female    DOB: 1980-11-30, 33 y.o.   MRN: [id number]  HPI Patient presents to the clinic with left knee pain and ankle pain. She was seen by Dr. Glade LloydMatheny on 12/13/2013. X-rays were normal at knee and ankle. She had an MRI done yesterday and does not know the results yet. She woke up this morning and 9/10 pain in her knee and radiated into her lateral left ankle. As the day has gone on the pain has gotten better. She has taken aspirin and ibuprofen and seems to help some. The pain can come back at any moment and intensify. The pain seems to be off and on and not aware of any triggers. She has been off her feet a lot today and does seem to be improving. Any type of weightbearing activity causes pain to radiate from the into ankle. Dr. Glade LloydMatheny gave her a small supply the hydrocodone that she has not used it because it makes her too sleepy. She has a very demanding job walking a lot and needs a note for work.   Review of Systems     Objective:   Physical Exam  Constitutional: She appears well-developed and well-nourished.  Musculoskeletal:  Patient not able to bear full weight on that left leg to to pain. Patient able to get full extension but flexion is limited due to pain at about 100. There was bilateral joint tenderness to palpation. There was evidence of joint effusion with fluid way on the anterior superior knee.   Left ankle appear to be swollen on the lateral side. No pain with palpation over the lateral malleolus.          Assessment & Plan:  Left knee pain/left ankle pain-discuss with MRI results concluding there was a mass of what seemed to be adipose tissue pushing on the anterior cruciate ligament. We are waiting on Dr. Karie Schwalbe. to give us his opinion on if this could be causing the pain or not. In the meantime stay nonweightbearing and I gave her crutches to use. I did not put in a knee brace because there was significant pain with palpation  over the joint space and patella. I gave her tramadol to use for breakthrough pain. Encouraged her to use up to 800 mg of ibuprofen up to 3 times a day. Rest and elevate left knee and left ankle until we find more information. Consider icing up to 15 minutes at a time for swelling reduction.

## 2013-12-18 NOTE — Telephone Encounter (Signed)
Pt called and stated that her ankle is swollen and her pain was 10 early this morning and now she is down to a 5. She is calling to find out if something can be done. I offered her an appt with Jade @ 145 today.Brianna Mendoza, Brianna Mendoza Brianna Mendoza\

## 2013-12-19 ENCOUNTER — Encounter: Payer: Self-pay | Admitting: Sports Medicine

## 2013-12-19 ENCOUNTER — Ambulatory Visit (INDEPENDENT_AMBULATORY_CARE_PROVIDER_SITE_OTHER): Payer: BC Managed Care – PPO | Admitting: Sports Medicine

## 2013-12-19 ENCOUNTER — Telehealth: Payer: Self-pay | Admitting: *Deleted

## 2013-12-19 ENCOUNTER — Telehealth: Payer: Self-pay | Admitting: Sports Medicine

## 2013-12-19 ENCOUNTER — Ambulatory Visit (INDEPENDENT_AMBULATORY_CARE_PROVIDER_SITE_OTHER): Payer: BC Managed Care – PPO

## 2013-12-19 VITALS — BP 125/92 | HR 99 | Ht 65.0 in | Wt 154.0 lb

## 2013-12-19 DIAGNOSIS — M542 Cervicalgia: Secondary | ICD-10-CM

## 2013-12-19 DIAGNOSIS — M436 Torticollis: Secondary | ICD-10-CM

## 2013-12-19 DIAGNOSIS — M2550 Pain in unspecified joint: Secondary | ICD-10-CM

## 2013-12-19 DIAGNOSIS — F39 Unspecified mood [affective] disorder: Secondary | ICD-10-CM

## 2013-12-19 DIAGNOSIS — M797 Fibromyalgia: Secondary | ICD-10-CM | POA: Insufficient documentation

## 2013-12-19 DIAGNOSIS — R937 Abnormal findings on diagnostic imaging of other parts of musculoskeletal system: Secondary | ICD-10-CM

## 2013-12-19 DIAGNOSIS — R936 Abnormal findings on diagnostic imaging of limbs: Secondary | ICD-10-CM

## 2013-12-19 LAB — CBC WITH DIFFERENTIAL/PLATELET
Basophils Absolute: 0 10*3/uL (ref 0.0–0.1)
Basophils Relative: 0 % (ref 0–1)
Eosinophils Absolute: 0.1 K/uL (ref 0.0–0.7)
Eosinophils Relative: 1 % (ref 0–5)
HCT: 40.5 % (ref 36.0–46.0)
Hemoglobin: 14.3 g/dL (ref 12.0–15.0)
Lymphocytes Relative: 44 % (ref 12–46)
Lymphs Abs: 3.3 10*3/uL (ref 0.7–4.0)
MCH: 31.6 pg (ref 26.0–34.0)
MCHC: 35.3 g/dL (ref 30.0–36.0)
MCV: 89.6 fL (ref 78.0–100.0)
Monocytes Absolute: 0.5 10*3/uL (ref 0.1–1.0)
Monocytes Relative: 6 % (ref 3–12)
Neutro Abs: 3.7 K/uL (ref 1.7–7.7)
Neutrophils Relative %: 49 % (ref 43–77)
Platelets: 335 K/uL (ref 150–400)
RBC: 4.52 MIL/uL (ref 3.87–5.11)
RDW: 13.2 % (ref 11.5–15.5)
WBC: 7.6 10*3/uL (ref 4.0–10.5)

## 2013-12-19 LAB — SEDIMENTATION RATE: Sed Rate: 5 mm/hr (ref 0–22)

## 2013-12-19 MED ORDER — VENLAFAXINE HCL ER 75 MG PO CP24: 75.0000 mg | ORAL_CAPSULE | Freq: Every day | ORAL | Status: DC

## 2013-12-19 MED ORDER — METHYLPREDNISOLONE SODIUM SUCC 125 MG IJ SOLR
125.0000 mg | Freq: Once | INTRAMUSCULAR | Status: AC
  Administered 2013-12-19: 125 mg via INTRAMUSCULAR

## 2013-12-19 MED ORDER — IBUPROFEN 800 MG PO TABS: 800.0000 mg | ORAL_TABLET | Freq: Three times a day (TID) | ORAL | Status: DC | PRN

## 2013-12-19 MED ORDER — VENLAFAXINE HCL ER 37.5 MG PO CP24: 37.5000 mg | ORAL_CAPSULE | Freq: Every day | ORAL | Status: DC

## 2013-12-19 MED ORDER — METHYLPREDNISOLONE ACETATE 80 MG/ML IJ SUSP
80.0000 mg | Freq: Once | INTRAMUSCULAR | Status: AC
  Administered 2013-12-19: 80 mg via INTRAMUSCULAR

## 2013-12-19 NOTE — Assessment & Plan Note (Signed)
Meets all criteria for depression. I'm going to start Effexor. Certainly this is a contributing factor for multiple joint aches and migratory pain.

## 2013-12-19 NOTE — Progress Notes (Signed)
   Subjective:    I'm seeing this patient as a consultation for:  Christianne Dolinatherine Matheny, M.D., Tandy GawJade Breeback, PA-C.  CC: Polyarthralgia  HPI: This is a pleasant 33 year old female, she comes in with a multitude of musculoskeletal complaints including initial left knee pain, with swelling. Ultimately she became unable to bear weight and an MRI was ordered which did show a small knee effusion with some synovitis particularly around the anterior cruciate ligament. Anterior cruciate ligament was intact at that time, and she denied any injuries. She also endorses occasional swelling of her left ankle, as well as new pain in her neck. She does tell me that her knee pain is now essentially resolved, and that she was in the emergency department yesterday with migratory pains in her left side of the neck, radiating to the periscapular musculature. She denies any constitutional symptoms. On further questioning she does have depressed mood, she recently lost her job and her home, she has poor interest, poor energy, no suicidal or homicidal ideation, she does feel guilt, poor sleep, and anhedonia. She seems somewhat relieved that I asked her about her mood.  Past medical history, Surgical history, Family history not pertinant except as noted below, Social history, Allergies, and medications have been entered into the medical record, reviewed, and no changes needed.   Review of Systems: No headache, visual changes, nausea, vomiting, diarrhea, constipation, dizziness, abdominal pain, skin rash, fevers, chills, night sweats, weight loss, swollen lymph nodes, body aches, joint swelling, muscle aches, chest pain, shortness of breath, mood changes, visual or auditory hallucinations.   Objective:   General: Well Developed, well nourished, and in no acute distress.  Neuro/Psych: Alert and oriented x3, extra-ocular muscles intact, able to move all 4 extremities, sensation grossly intact. Skin: Warm and dry, no rashes noted.   Respiratory: Not using accessory muscles, speaking in full sentences, trachea midline.  Cardiovascular: Pulses palpable, no extremity edema. Abdomen: Does not appear distended. Neck: Inspection unremarkable. No palpable stepoffs. There is diffuse tenderness to palpation along the left paracervical muscles as well as periscapular muscles. Negative Spurling's maneuver. Full neck range of motion Grip strength and sensation normal in bilateral hands Strength good C4 to T1 distribution No sensory change to C4 to T1 Negative Hoffman sign bilaterally Reflexes normal Left Knee: Normal to inspection with no erythema or effusion or obvious bony abnormalities. There is only minimal joint line pain. ROM full in flexion and extension and lower leg rotation. Ligaments with solid consistent endpoints including ACL, PCL, LCL, MCL. Negative Mcmurray's, Apley's, and Thessalonian tests. Non painful patellar compression. Patellar glide without crepitus. Patellar and quadriceps tendons unremarkable. Hamstring and quadriceps strength is normal.  Left Ankle: No visible erythema or swelling. Range of motion is full in all directions. Strength is 5/5 in all directions. Stable lateral and medial ligaments; squeeze test and kleiger test unremarkable; Talar dome nontender; No pain at base of 5th MT; No tenderness over cuboid; No tenderness over N spot or navicular prominence No tenderness on posterior aspects of lateral and medial malleolus No sign of peroneal tendon subluxations or tenderness to palpation Negative tarsal tunnel tinel's Able to walk 4 steps.  Cervical spine x-rays do show loss of the normal cervical lordosis.  Impression and Recommendations:   This case required medical decision making of moderate complexity.

## 2013-12-19 NOTE — Telephone Encounter (Signed)
Patient was seen today ibuprofen  600 mg was not at the pharmacy and her leg is in pain and need it called in asap to [facility] on [address]

## 2013-12-19 NOTE — Telephone Encounter (Signed)
Pt stated that she is still in pain. The pain has moved into her back and she has not been able to sleep because of the pain.  She went to the ED last night and they gave her a Toradol injection which gave her some relief for a short period of time however she still is in pain. Pt was told by Joice LoftsAmber that Dr. Benjamin Stainhekkekandam is going to see her this morning.Loralee PacasBarkley, Mischell Branford UlenLynetta

## 2013-12-19 NOTE — Telephone Encounter (Signed)
Called in.

## 2013-12-19 NOTE — Assessment & Plan Note (Signed)
Currently in the neck, knee, and ankle with mild swelling. Cervical spine x-ray, I'm going to proceed with a rheumatologic workup considering multiple arthralgias. Solu-Medrol 125, Depo-Medrol 80 here in the office.

## 2013-12-19 NOTE — Assessment & Plan Note (Signed)
I did review the MRI personally, I do suspect this represents more of a synovitis diffusely rather than a local process. See assessment and plan for polyarthralgia.

## 2013-12-20 LAB — COMPREHENSIVE METABOLIC PANEL
AST: 14 U/L (ref 0–37)
Albumin: 4.1 g/dL (ref 3.5–5.2)
Alkaline Phosphatase: 76 U/L (ref 39–117)
BUN: 8 mg/dL (ref 6–23)
CO2: 25 mEq/L (ref 19–32)
Calcium: 9.8 mg/dL (ref 8.4–10.5)
Chloride: 105 mEq/L (ref 96–112)
Glucose, Bld: 88 mg/dL (ref 70–99)
Potassium: 3.7 mEq/L (ref 3.5–5.3)
Sodium: 140 mEq/L (ref 135–145)
Total Protein: 6.9 g/dL (ref 6.0–8.3)

## 2013-12-20 LAB — CK: Total CK: 119 U/L (ref 7–177)

## 2013-12-20 LAB — CYCLIC CITRUL PEPTIDE ANTIBODY, IGG: Cyclic Citrullin Peptide Ab: <2 U/mL (ref 0.0–5.0)

## 2013-12-20 LAB — COMPREHENSIVE METABOLIC PANEL WITH GFR
ALT: 13 U/L (ref 0–35)
Creat: 1.02 mg/dL (ref 0.50–1.10)
Total Bilirubin: 0.6 mg/dL (ref 0.2–1.2)

## 2013-12-20 LAB — RHEUMATOID FACTOR: Rheumatoid fact SerPl-aCnc: <10 [IU]/mL (ref <=14)

## 2013-12-20 LAB — ANA: Anti Nuclear Antibody(ANA): NEGATIVE

## 2013-12-27 ENCOUNTER — Telehealth: Payer: Self-pay | Admitting: Family Medicine

## 2013-12-27 NOTE — Telephone Encounter (Signed)
Dr.T please see note below about excuse from work.   I did not see in Dr. Karie Schwalbe last noted where he took patient out of work so I will forward this message to him to see if he will write one. Merridith Dershem,CMA

## 2013-12-27 NOTE — Telephone Encounter (Signed)
Pt called. She states she saw Dr "T" and he put her out of work for 2 wks and she will be needing a note for work with those dates she is out, i.e.  3/11- 4/2 so that she can be paid.  Pt will call back between 2:00 & 3:00 to see if note is ready.

## 2013-12-27 NOTE — Telephone Encounter (Signed)
"  Noted," hehe.  It'll be in my box.

## 2014-01-06 DIAGNOSIS — H52229 Regular astigmatism, unspecified eye: Secondary | ICD-10-CM | POA: Insufficient documentation

## 2014-01-09 ENCOUNTER — Encounter: Payer: Self-pay | Admitting: Sports Medicine

## 2014-01-09 ENCOUNTER — Ambulatory Visit (INDEPENDENT_AMBULATORY_CARE_PROVIDER_SITE_OTHER): Payer: BC Managed Care – PPO | Admitting: Sports Medicine

## 2014-01-09 VITALS — BP 144/87 | HR 112 | Ht 65.0 in | Wt 150.0 lb

## 2014-01-09 DIAGNOSIS — M2550 Pain in unspecified joint: Secondary | ICD-10-CM

## 2014-01-09 DIAGNOSIS — IMO0001 Reserved for inherently not codable concepts without codable children: Secondary | ICD-10-CM

## 2014-01-09 DIAGNOSIS — M7918 Myalgia, other site: Secondary | ICD-10-CM

## 2014-01-09 MED ORDER — VENLAFAXINE HCL ER 150 MG PO CP24: 150.0000 mg | ORAL_CAPSULE | Freq: Every day | ORAL | Status: DC

## 2014-01-09 NOTE — Assessment & Plan Note (Signed)
With pain all over. She did also meet criteria for depression. She has noted a fantastic response to Effexor at 75 mg and tells me she feels like jumping up and down. She continues to have occasional pain symptoms in the morning, I am going to increase to 150 mg. Like to see her back one more time in a month, and then I will turn this over to Dr. Linford ArnoldMetheney. She is out of work until April 13.

## 2014-01-09 NOTE — Progress Notes (Signed)
  Subjective:    CC: Followup  HPI: We saw this pleasant 33 year old female sometime ago, a rheumatoid workup was completely negative and imaging studies were negative with the exception of some loss of the cervical lordosis. As I could not find any mechanical orthopedic process, my suspicion was for myofascial pain, she did have some depressive symptoms. We started Effexor, and she returns today feeling significantly better. She tells me she could almost jump up and down enjoy. Her pain is over 50% improved most days. She is currently interviewing for a position with the [employer].  Past medical history, Surgical history, Family history not pertinant except as noted below, Social history, Allergies, and medications have been entered into the medical record, reviewed, and no changes needed.   Review of Systems: No fevers, chills, night sweats, weight loss, chest pain, or shortness of breath.   Objective:    General: Well Developed, well nourished, and in no acute distress.  Neuro: Alert and oriented x3, extra-ocular muscles intact, sensation grossly intact.  HEENT: Normocephalic, atraumatic, pupils equal round reactive to light, neck supple, no masses, no lymphadenopathy, thyroid nonpalpable.  Skin: Warm and dry, no rashes. Cardiac: Regular rate and rhythm, no murmurs rubs or gallops, no lower extremity edema.  Respiratory: Clear to auscultation bilaterally. Not using accessory muscles, speaking in full sentences.  Impression and Recommendations:

## 2014-01-09 NOTE — Assessment & Plan Note (Signed)
X-rays and rheumatologic workup were negative. Symptoms are likely myofascial considering her fantastic response to Effexor.

## 2014-02-06 ENCOUNTER — Ambulatory Visit (INDEPENDENT_AMBULATORY_CARE_PROVIDER_SITE_OTHER): Payer: BC Managed Care – PPO | Admitting: Sports Medicine

## 2014-02-06 ENCOUNTER — Encounter: Payer: Self-pay | Admitting: Sports Medicine

## 2014-02-06 VITALS — BP 140/96 | HR 99 | Wt 148.0 lb

## 2014-02-06 DIAGNOSIS — IMO0001 Reserved for inherently not codable concepts without codable children: Secondary | ICD-10-CM

## 2014-02-06 DIAGNOSIS — M7918 Myalgia, other site: Secondary | ICD-10-CM

## 2014-02-06 MED ORDER — TRAMADOL HCL 50 MG PO TABS: ORAL_TABLET | ORAL | Status: DC

## 2014-02-06 MED ORDER — VENLAFAXINE HCL ER 150 MG PO CP24: 150.0000 mg | ORAL_CAPSULE | Freq: Every day | ORAL | Status: DC

## 2014-02-06 MED ORDER — PREGABALIN 50 MG PO CAPS: 50.0000 mg | ORAL_CAPSULE | Freq: Two times a day (BID) | ORAL | Status: DC

## 2014-02-06 MED ORDER — VENLAFAXINE HCL ER 75 MG PO CP24
75.0000 mg | ORAL_CAPSULE | Freq: Every day | ORAL | Status: DC
Start: 2014-02-06 — End: 2014-04-08

## 2014-02-06 NOTE — Progress Notes (Signed)
  Subjective:    CC: Followup  HPI: This is a very pleasant 33 year old female with myofascial pain syndrome and depression. She is currently on 150 mg of venlafaxine, as is effective, but not completely. She continues to have some depressive symptoms, and myofascial pain type symptoms. They are mild, improving, significantly since we increased to 150 mg of Effexor. She does still endorse poor energy, concentration, depressed mood, poor sleep, poor appetite. No suicidal or homicidal ideation. She did not follow through with her application to the [employer].  Past medical history, Surgical history, Family history not pertinant except as noted below, Social history, Allergies, and medications have been entered into the medical record, reviewed, and no changes needed.   Review of Systems: No fevers, chills, night sweats, weight loss, chest pain, or shortness of breath.   Objective:    General: Well Developed, well nourished, and in no acute distress.  Neuro: Alert and oriented x3, extra-ocular muscles intact, sensation grossly intact.  HEENT: Normocephalic, atraumatic, pupils equal round reactive to light, neck supple, no masses, no lymphadenopathy, thyroid nonpalpable.  Skin: Warm and dry, no rashes. Cardiac: Regular rate and rhythm, no murmurs rubs or gallops, no lower extremity edema.  Respiratory: Clear to auscultation bilaterally. Not using accessory muscles, speaking in full sentences.  Impression and Recommendations:

## 2014-02-06 NOTE — Assessment & Plan Note (Addendum)
Continues to have some symptoms of depression. She did improve with increasing to 150 mg of venlafaxine. Increasing to the max dose of 225 mg. And tramadol for use as needed, and Lyrica for use as needed/at bedtime.

## 2014-02-20 ENCOUNTER — Encounter: Payer: Self-pay | Admitting: Sports Medicine

## 2014-02-20 ENCOUNTER — Ambulatory Visit (INDEPENDENT_AMBULATORY_CARE_PROVIDER_SITE_OTHER): Payer: BC Managed Care – PPO | Admitting: Sports Medicine

## 2014-02-20 VITALS — BP 144/90 | HR 110 | Resp 20

## 2014-02-20 DIAGNOSIS — F410 Panic disorder [episodic paroxysmal anxiety] without agoraphobia: Secondary | ICD-10-CM

## 2014-02-20 NOTE — Assessment & Plan Note (Addendum)
Patient was seen without an appointment on an emergent basis disrupting normal clinic functioning. Acute stress reaction tissue at work. She became presyncopal while hyperventilating and passed out. All vital signs were completely stable, I placed 2 mg of IV Ativan, she calmed down, and was able to talk to me. She had a small abrasion on her right arm which was dressed. We are going to write a letter to her work explaining her diagnosis. 

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
## 2014-03-06 ENCOUNTER — Encounter: Payer: Self-pay | Admitting: Sports Medicine

## 2014-03-06 ENCOUNTER — Ambulatory Visit (INDEPENDENT_AMBULATORY_CARE_PROVIDER_SITE_OTHER): Payer: BC Managed Care – PPO | Admitting: Sports Medicine

## 2014-03-06 VITALS — BP 138/93 | HR 101 | Ht 65.0 in | Wt 149.0 lb

## 2014-03-06 DIAGNOSIS — F410 Panic disorder [episodic paroxysmal anxiety] without agoraphobia: Secondary | ICD-10-CM

## 2014-03-06 DIAGNOSIS — IMO0001 Reserved for inherently not codable concepts without codable children: Secondary | ICD-10-CM

## 2014-03-06 DIAGNOSIS — M7918 Myalgia, other site: Secondary | ICD-10-CM

## 2014-03-06 MED ORDER — PREGABALIN 75 MG PO CAPS: 75.0000 mg | ORAL_CAPSULE | Freq: Two times a day (BID) | ORAL | Status: DC

## 2014-03-06 NOTE — Progress Notes (Signed)
  Subjective:    CC:  Follow up  HPI: Anxiety: Well controlled on current medications including Effexor 225 mg and an occasional Lyrica as needed for panic.  Myofascial pain: Well controlled with Lyrica, Effexor, and tramadol occasionally.  Past medical history, Surgical history, Family history not pertinant except as noted below, Social history, Allergies, and medications have been entered into the medical record, reviewed, and no changes needed.   Review of Systems: No fevers, chills, night sweats, weight loss, chest pain, or shortness of breath.   Objective:    General: Well Developed, well nourished, and in no acute distress.  Neuro: Alert and oriented x3, extra-ocular muscles intact, sensation grossly intact.  HEENT: Normocephalic, atraumatic, pupils equal round reactive to light, neck supple, no masses, no lymphadenopathy, thyroid nonpalpable.  Skin: Warm and dry, no rashes. Cardiac: Regular rate and rhythm, no murmurs rubs or gallops, no lower extremity edema.  Respiratory: Clear to auscultation bilaterally. Not using accessory muscles, speaking in full sentences.  Impression and Recommendations:

## 2014-03-06 NOTE — Assessment & Plan Note (Signed)
Well-controlled with current regimen of Effexor 225 mg, Lyrica 75 mg twice a day, and an occasional tramadol. No changes.

## 2014-03-06 NOTE — Assessment & Plan Note (Signed)
She can use occasional Lyrica during panic episodes. I have repeatedly asked her to follow up with her PCP for psychiatric-related complaints. Lyrica refilled and samples given.

## 2014-04-08 ENCOUNTER — Other Ambulatory Visit: Payer: Self-pay | Admitting: Sports Medicine

## 2014-04-21 ENCOUNTER — Ambulatory Visit (INDEPENDENT_AMBULATORY_CARE_PROVIDER_SITE_OTHER): Payer: BC Managed Care – PPO | Admitting: Sports Medicine

## 2014-04-21 ENCOUNTER — Ambulatory Visit: Payer: Self-pay

## 2014-04-21 ENCOUNTER — Encounter: Payer: Self-pay | Admitting: Sports Medicine

## 2014-04-21 DIAGNOSIS — M546 Pain in thoracic spine: Secondary | ICD-10-CM

## 2014-04-21 DIAGNOSIS — M542 Cervicalgia: Secondary | ICD-10-CM

## 2014-04-21 DIAGNOSIS — M545 Low back pain, unspecified: Secondary | ICD-10-CM

## 2014-04-21 DIAGNOSIS — M7918 Myalgia, other site: Secondary | ICD-10-CM

## 2014-04-21 DIAGNOSIS — IMO0001 Reserved for inherently not codable concepts without codable children: Secondary | ICD-10-CM

## 2014-04-21 DIAGNOSIS — F410 Panic disorder [episodic paroxysmal anxiety] without agoraphobia: Secondary | ICD-10-CM

## 2014-04-21 MED ORDER — PREGABALIN 75 MG PO CAPS: 75.0000 mg | ORAL_CAPSULE | Freq: Two times a day (BID) | ORAL | Status: DC

## 2014-04-21 MED ORDER — PREDNISONE (PAK) 10 MG PO TABS: ORAL_TABLET | ORAL | Status: DC

## 2014-04-21 MED ORDER — KETOROLAC TROMETHAMINE 30 MG/ML IJ SOLN
30.0000 mg | Freq: Once | INTRAMUSCULAR | Status: AC
  Administered 2014-04-21: 30 mg via INTRAMUSCULAR

## 2014-04-21 NOTE — Assessment & Plan Note (Signed)
Doing extremely well on 225 mg of venlafaxine and occasional Lyrica. Lyrica samples given, as well as a discount coupon.

## 2014-04-21 NOTE — Assessment & Plan Note (Addendum)
Toradol 30 mg intramuscular. Prednisone taper Soft cervical collar for one week. CT of the cervical, thoracic, and lumbar spine to evaluate for fracture. Likely all represents muscle spasm. It will resolve. Out of work for 3 weeks, note written, if CT is negative, she can return to work in 3 weeks.
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
## 2014-04-22 ENCOUNTER — Ambulatory Visit (INDEPENDENT_AMBULATORY_CARE_PROVIDER_SITE_OTHER): Payer: BC Managed Care – PPO

## 2014-04-22 DIAGNOSIS — M549 Dorsalgia, unspecified: Secondary | ICD-10-CM

## 2014-04-23 ENCOUNTER — Ambulatory Visit: Payer: BC Managed Care – PPO | Admitting: Family Medicine

## 2014-05-12 ENCOUNTER — Encounter: Payer: Self-pay | Admitting: Sports Medicine

## 2014-05-12 ENCOUNTER — Ambulatory Visit (INDEPENDENT_AMBULATORY_CARE_PROVIDER_SITE_OTHER): Payer: BC Managed Care – PPO | Admitting: Sports Medicine

## 2014-05-12 VITALS — BP 127/84 | HR 102 | Ht 65.0 in | Wt 151.0 lb

## 2014-05-12 DIAGNOSIS — I10 Essential (primary) hypertension: Secondary | ICD-10-CM

## 2014-05-12 DIAGNOSIS — M7918 Myalgia, other site: Secondary | ICD-10-CM

## 2014-05-12 DIAGNOSIS — R062 Wheezing: Secondary | ICD-10-CM

## 2014-05-12 DIAGNOSIS — IMO0001 Reserved for inherently not codable concepts without codable children: Secondary | ICD-10-CM

## 2014-05-12 MED ORDER — ALBUTEROL SULFATE HFA 108 (90 BASE) MCG/ACT IN AERS: 2.0000 | INHALATION_SPRAY | Freq: Four times a day (QID) | RESPIRATORY_TRACT | Status: DC | PRN

## 2014-05-12 MED ORDER — PREGABALIN 100 MG PO CAPS: 100.0000 mg | ORAL_CAPSULE | Freq: Two times a day (BID) | ORAL | Status: DC

## 2014-05-12 NOTE — Assessment & Plan Note (Signed)
Doing extremely well on Effexor at 225 mg and Lyrica at 75 mg twice a day. Increasing Lyrica to 100 mg twice a day. Return in a month. She does want to try chiropractic care which I think is appropriate. Return to work.
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
## 2014-05-12 NOTE — Assessment & Plan Note (Signed)
Giving a short course of albuterol. She will return to see me in one month to see how things are going.

## 2014-05-19 ENCOUNTER — Other Ambulatory Visit: Payer: Self-pay | Admitting: Sports Medicine

## 2014-05-19 MED ORDER — VENLAFAXINE HCL ER 225 MG PO TB24: 1.0000 | ORAL_TABLET | Freq: Every day | ORAL | Status: DC

## 2014-05-19 MED ORDER — IBUPROFEN 800 MG PO TABS: 800.0000 mg | ORAL_TABLET | Freq: Three times a day (TID) | ORAL | Status: DC | PRN

## 2014-05-23 ENCOUNTER — Telehealth: Payer: Self-pay | Admitting: Sports Medicine

## 2014-05-23 NOTE — Telephone Encounter (Signed)
Patient called and advised that she needs to speak with you please because her job has fired her she stated its due to her appointments etc. I sent her call to rhonda's voicemail because you were still seeing patients but I wanted to send a phone note as well. Thanks

## 2014-05-23 NOTE — Telephone Encounter (Signed)
I'm happy to write a letter documenting the dates and times of her office visits, but this is unlikely to sway the employers decision.

## 2014-05-26 NOTE — Telephone Encounter (Signed)
Letter is in my box 

## 2014-05-26 NOTE — Telephone Encounter (Signed)
Patient still wants the letter with the dates. Trystan Akhtar,CMA

## 2014-05-26 NOTE — Telephone Encounter (Signed)
Patient has been informed that letter is ready for pickup. [name],CMA  

## 2014-06-06 ENCOUNTER — Encounter: Payer: Self-pay | Admitting: Sports Medicine

## 2014-06-06 ENCOUNTER — Ambulatory Visit (INDEPENDENT_AMBULATORY_CARE_PROVIDER_SITE_OTHER): Payer: BC Managed Care – PPO | Admitting: Sports Medicine

## 2014-06-06 VITALS — BP 133/91 | HR 97 | Ht 65.5 in | Wt 160.0 lb

## 2014-06-06 DIAGNOSIS — M7918 Myalgia, other site: Secondary | ICD-10-CM

## 2014-06-06 DIAGNOSIS — IMO0001 Reserved for inherently not codable concepts without codable children: Secondary | ICD-10-CM

## 2014-06-06 NOTE — Assessment & Plan Note (Signed)
Very well-controlled with Effexor and Lyrica. No changes.
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
## 2014-07-15 ENCOUNTER — Telehealth: Payer: Self-pay | Admitting: *Deleted

## 2014-07-15 ENCOUNTER — Other Ambulatory Visit: Payer: Self-pay

## 2014-07-15 MED ORDER — VENLAFAXINE HCL ER 225 MG PO TB24: 1.0000 | ORAL_TABLET | Freq: Every day | ORAL | Status: DC

## 2014-08-11 ENCOUNTER — Other Ambulatory Visit: Payer: Self-pay | Admitting: *Deleted

## 2014-08-11 DIAGNOSIS — M7918 Myalgia, other site: Secondary | ICD-10-CM

## 2014-08-11 MED ORDER — PREGABALIN 100 MG PO CAPS: 100.0000 mg | ORAL_CAPSULE | Freq: Two times a day (BID) | ORAL | Status: DC

## 2014-08-11 NOTE — Progress Notes (Signed)
LMOM regarding refill of lyrica and that it was sent to her pharmacy. Corliss SkainsJamie Zebediah Beezley, RMA

## 2014-09-12 ENCOUNTER — Encounter: Payer: Self-pay | Admitting: Sports Medicine

## 2014-09-12 ENCOUNTER — Ambulatory Visit (INDEPENDENT_AMBULATORY_CARE_PROVIDER_SITE_OTHER): Payer: BC Managed Care – PPO | Admitting: Sports Medicine

## 2014-09-12 VITALS — BP 156/105 | HR 107 | Ht 66.0 in | Wt 153.0 lb

## 2014-09-12 DIAGNOSIS — M797 Fibromyalgia: Secondary | ICD-10-CM

## 2014-09-12 DIAGNOSIS — I10 Essential (primary) hypertension: Secondary | ICD-10-CM

## 2014-09-12 DIAGNOSIS — M7918 Myalgia, other site: Secondary | ICD-10-CM

## 2014-09-12 NOTE — Progress Notes (Signed)
  Subjective:    CC: follow-up  HPI: Myofascial pain syndrome: Unfortunately this pleasant 33 year old female has lost her job, and is unable to afford her medicines, not surprisingly is having a worsening and recurrence of myofascial pain symptoms.  No suicidal or homicidal ideation but certainly frustrated and upset.  Hypertension: Out of medications.  Past medical history, Surgical history, Family history not pertinant except as noted below, Social history, Allergies, and medications have been entered into the medical record, reviewed, and no changes needed.   Review of Systems: No fevers, chills, night sweats, weight loss, chest pain, or shortness of breath.   Objective:    General: Well Developed, well nourished, and in no acute distress.  Neuro: Alert and oriented x3, extra-ocular muscles intact, sensation grossly intact.  HEENT: Normocephalic, atraumatic, pupils equal round reactive to light, neck supple, no masses, no lymphadenopathy, thyroid nonpalpable.  Skin: Warm and dry, no rashes. Cardiac: Regular rate and rhythm, no murmurs rubs or gallops, no lower extremity edema.  Respiratory: Clear to auscultation bilaterally. Not using accessory muscles, speaking in full sentences.  Impression and Recommendations:    I spent 25 minutes with this patient, greater than 50% was face-to-face time counseling and listening to her story about losing her job.

## 2014-09-12 NOTE — Assessment & Plan Note (Addendum)
Recurrence of symptoms, off medication, lost job. Needs hard copy refills. No SI/HI

## 2014-09-12 NOTE — Assessment & Plan Note (Signed)
Refilling meds

## 2014-09-18 ENCOUNTER — Telehealth: Payer: Self-pay | Admitting: Sports Medicine

## 2014-09-18 DIAGNOSIS — M7918 Myalgia, other site: Secondary | ICD-10-CM

## 2014-09-18 NOTE — Telephone Encounter (Signed)
Patient called advised that she was seen last Friday but our computer system was down and Dr. Karie Schwalbe forgot to give her a prescription for Tramadol and would like to know if that can be called into her pharmacy. Thanks

## 2014-09-18 NOTE — Telephone Encounter (Signed)
Dr. T please see note below. Kristen Fromm,CMA  

## 2014-09-19 MED ORDER — TRAMADOL HCL 50 MG PO TABS: ORAL_TABLET | ORAL | Status: DC

## 2014-09-19 NOTE — Telephone Encounter (Signed)
Rx in box. 

## 2014-09-19 NOTE — Telephone Encounter (Signed)
Tramadol has been faxed to [facility]. [name],CMA  

## 2014-11-12 NOTE — Telephone Encounter (Signed)
error 

## 2014-11-18 ENCOUNTER — Other Ambulatory Visit: Payer: Self-pay | Admitting: Family Medicine

## 2014-12-03 ENCOUNTER — Other Ambulatory Visit: Payer: Self-pay

## 2014-12-03 DIAGNOSIS — M7918 Myalgia, other site: Secondary | ICD-10-CM

## 2014-12-03 MED ORDER — VENLAFAXINE HCL ER 225 MG PO TB24: 1.0000 | ORAL_TABLET | Freq: Every day | ORAL | Status: DC

## 2014-12-03 MED ORDER — IBUPROFEN 800 MG PO TABS: 800.0000 mg | ORAL_TABLET | Freq: Three times a day (TID) | ORAL | Status: DC | PRN

## 2014-12-03 MED ORDER — TRAMADOL HCL 50 MG PO TABS: ORAL_TABLET | ORAL | Status: DC

## 2014-12-24 ENCOUNTER — Other Ambulatory Visit: Payer: Self-pay | Admitting: Family Medicine

## 2014-12-25 NOTE — Telephone Encounter (Signed)
Patient needs to see Provider for appointment. 

## 2015-01-14 ENCOUNTER — Other Ambulatory Visit: Payer: Self-pay | Admitting: Family Medicine

## 2015-04-16 ENCOUNTER — Encounter: Payer: Self-pay | Admitting: Sports Medicine

## 2015-04-16 ENCOUNTER — Ambulatory Visit (INDEPENDENT_AMBULATORY_CARE_PROVIDER_SITE_OTHER): Payer: Managed Care, Other (non HMO) | Admitting: Sports Medicine

## 2015-04-16 VITALS — BP 149/100 | HR 112 | Wt 152.0 lb

## 2015-04-16 DIAGNOSIS — I10 Essential (primary) hypertension: Secondary | ICD-10-CM

## 2015-04-16 DIAGNOSIS — M797 Fibromyalgia: Secondary | ICD-10-CM

## 2015-04-16 DIAGNOSIS — M7918 Myalgia, other site: Secondary | ICD-10-CM

## 2015-04-16 MED ORDER — HYDROCHLOROTHIAZIDE 12.5 MG PO CAPS: ORAL_CAPSULE | ORAL | Status: DC

## 2015-04-16 MED ORDER — VENLAFAXINE HCL ER 37.5 MG PO CP24: ORAL_CAPSULE | ORAL | Status: DC

## 2015-04-16 MED ORDER — VENLAFAXINE HCL ER 75 MG PO CP24: 75.0000 mg | ORAL_CAPSULE | Freq: Every day | ORAL | Status: DC

## 2015-04-16 MED ORDER — TRAMADOL HCL 50 MG PO TABS: ORAL_TABLET | ORAL | Status: DC

## 2015-04-16 MED ORDER — PREGABALIN 100 MG PO CAPS: 100.0000 mg | ORAL_CAPSULE | Freq: Two times a day (BID) | ORAL | Status: DC

## 2015-04-16 NOTE — Progress Notes (Signed)
  Subjective:    CC: Follow-up  HPI: Myofascial pain syndrome: Has been out of venlafaxine, Lyrica, tramadol for sometime now, not apprised has worsening of symptoms, muscle aches, and mood. She has a new job now and is able to get her medication, needs refills on everything.  Hypertension: Has been out of her medication for a while now, again as above she does have a new job, and is able to refills on everything.  Past medical history, Surgical history, Family history not pertinant except as noted below, Social history, Allergies, and medications have been entered into the medical record, reviewed, and no changes needed.   Review of Systems: No fevers, chills, night sweats, weight loss, chest pain, or shortness of breath.   Objective:    General: Well Developed, well nourished, and in no acute distress.  Neuro: Alert and oriented x3, extra-ocular muscles intact, sensation grossly intact.  HEENT: Normocephalic, atraumatic, pupils equal round reactive to light, neck supple, no masses, no lymphadenopathy, thyroid nonpalpable.  Skin: Warm and dry, no rashes. Cardiac: Regular rate and rhythm, no murmurs rubs or gallops, no lower extremity edema.  Respiratory: Clear to auscultation bilaterally. Not using accessory muscles, speaking in full sentences.  Impression and Recommendations:    I spent 25 minutes with this patient, greater than 50% was face-to-face time counseling regarding the above diagnoses

## 2015-04-16 NOTE — Assessment & Plan Note (Signed)
Refilling venlafaxine, Lyrica, tramadol.

## 2015-04-16 NOTE — Assessment & Plan Note (Signed)
Elevated blood pressure, Refilling medication, she will follow this up with her PCP.

## 2015-05-05 ENCOUNTER — Encounter: Payer: Self-pay | Admitting: Family Medicine

## 2015-05-05 ENCOUNTER — Ambulatory Visit (INDEPENDENT_AMBULATORY_CARE_PROVIDER_SITE_OTHER): Payer: Managed Care, Other (non HMO) | Admitting: Family Medicine

## 2015-05-05 VITALS — BP 123/76 | HR 99 | Ht 66.0 in | Wt 157.0 lb

## 2015-05-05 DIAGNOSIS — Z13220 Encounter for screening for lipoid disorders: Secondary | ICD-10-CM | POA: Diagnosis not present

## 2015-05-05 DIAGNOSIS — M797 Fibromyalgia: Secondary | ICD-10-CM | POA: Diagnosis not present

## 2015-05-05 DIAGNOSIS — Z114 Encounter for screening for human immunodeficiency virus [HIV]: Secondary | ICD-10-CM

## 2015-05-05 DIAGNOSIS — I10 Essential (primary) hypertension: Secondary | ICD-10-CM | POA: Diagnosis not present

## 2015-05-05 DIAGNOSIS — M7918 Myalgia, other site: Secondary | ICD-10-CM

## 2015-05-05 MED ORDER — HYDROCHLOROTHIAZIDE 12.5 MG PO CAPS: ORAL_CAPSULE | ORAL | Status: DC

## 2015-05-05 NOTE — Progress Notes (Signed)
   Subjective:    Patient ID: Brianna Mendoza, female    DOB: 09-17-1981, 34 y.o.   MRN: [id number]  HPI  Hypertension- Pt denies chest pain, SOB, dizziness, or heart palpitations.  Taking meds as directed w/o problems.  Denies medication side effects.  She is planning on making dietary changes.  She is stating an exercise routine.   She recently got a new job working for [employer] and finally has [employer] again. She really just wants to get back on track with her health overall. She is back on her Lyrica and Effexor and is doing well with this regimen.  Review of Systems     Objective:   Physical Exam  Constitutional: She is oriented to person, place, and time. She appears well-developed and well-nourished.  HENT:  Head: Normocephalic and atraumatic.  Cardiovascular: Normal rate, regular rhythm and normal heart sounds.   Pulmonary/Chest: Effort normal and breath sounds normal.  Neurological: She is alert and oriented to person, place, and time.  Skin: Skin is warm and dry.  Psychiatric: She has a normal mood and affect. Her behavior is normal.          Assessment & Plan:  HTN - Well controlled. F/U in 6 months.  She plans on getting a BP machine to check it at home. I encouraged her to get back on track with diet and exercise which she plans to. She seems very motivated today. Due for CMP and fasting lipid panel. Refills sent to the pharmacy.  Myofascial pain-back on her Lyrica and Effexor and doing well. Following with Dr. Benjamin Stain for this.  I did update her information. Her tetanus and Pap smear are both up-to-date.

## 2015-06-04 ENCOUNTER — Other Ambulatory Visit: Payer: Self-pay

## 2015-06-04 MED ORDER — IBUPROFEN 800 MG PO TABS: 800.0000 mg | ORAL_TABLET | Freq: Three times a day (TID) | ORAL | Status: DC | PRN

## 2015-06-12 ENCOUNTER — Other Ambulatory Visit: Payer: Self-pay | Admitting: Sports Medicine

## 2015-07-08 ENCOUNTER — Other Ambulatory Visit: Payer: Self-pay | Admitting: Sports Medicine

## 2015-07-31 ENCOUNTER — Other Ambulatory Visit: Payer: Self-pay | Admitting: Sports Medicine

## 2015-08-11 ENCOUNTER — Encounter: Payer: Self-pay | Admitting: Sports Medicine

## 2015-08-11 ENCOUNTER — Ambulatory Visit (INDEPENDENT_AMBULATORY_CARE_PROVIDER_SITE_OTHER): Payer: Managed Care, Other (non HMO) | Admitting: Sports Medicine

## 2015-08-11 DIAGNOSIS — M7918 Myalgia, other site: Secondary | ICD-10-CM

## 2015-08-11 DIAGNOSIS — M797 Fibromyalgia: Secondary | ICD-10-CM | POA: Diagnosis not present

## 2015-08-11 MED ORDER — PREGABALIN 150 MG PO CAPS: 150.0000 mg | ORAL_CAPSULE | Freq: Two times a day (BID) | ORAL | Status: DC

## 2015-08-11 MED ORDER — CYCLOBENZAPRINE HCL 10 MG PO TABS: ORAL_TABLET | ORAL | Status: DC

## 2015-08-11 MED ORDER — VENLAFAXINE HCL ER 150 MG PO CP24: 150.0000 mg | ORAL_CAPSULE | Freq: Every day | ORAL | Status: DC

## 2015-08-11 NOTE — Progress Notes (Signed)
  Subjective:    CC: recheck fibromyalgia  HPI: This is a pleasant 34 year old female, she was well-controlled on 100 mg of Lyrica, and 75 mg of Effexor. She does desire to go up on the doses of both. Otherwise feeling okay.  Past medical history, Surgical history, Family history not pertinant except as noted below, Social history, Allergies, and medications have been entered into the medical record, reviewed, and no changes needed.   Review of Systems: No fevers, chills, night sweats, weight loss, chest pain, or shortness of breath.   Objective:    General: Well Developed, well nourished, and in no acute distress.  Neuro: Alert and oriented x3, extra-ocular muscles intact, sensation grossly intact.  HEENT: Normocephalic, atraumatic, pupils equal round reactive to light, neck supple, no masses, no lymphadenopathy, thyroid nonpalpable.  Skin: Warm and dry, no rashes. Cardiac: Regular rate and rhythm, no murmurs rubs or gallops, no lower extremity edema.  Respiratory: Clear to auscultation bilaterally. Not using accessory muscles, speaking in full sentences.  Impression and Recommendations:    I spent 25 minutes with this patient, greater than 50% was face-to-face time counseling regarding the above diagnoses

## 2015-08-11 NOTE — Assessment & Plan Note (Signed)
Per patient request increasing to 150 mg of Lyrica and Effexor. Adding a bit of muscle relaxer. Return as needed.

## 2015-08-12 ENCOUNTER — Other Ambulatory Visit: Payer: Self-pay | Admitting: Sports Medicine

## 2015-08-18 ENCOUNTER — Other Ambulatory Visit: Payer: Self-pay | Admitting: Family Medicine

## 2015-08-18 MED ORDER — PROMETHAZINE HCL 25 MG PO TABS: 25.0000 mg | ORAL_TABLET | Freq: Three times a day (TID) | ORAL | Status: DC | PRN

## 2015-08-18 NOTE — Progress Notes (Signed)
Daughter seen for viral gastroenteritis and now she is feeling nauseated.

## 2015-08-31 ENCOUNTER — Other Ambulatory Visit: Payer: Self-pay | Admitting: Sports Medicine

## 2015-08-31 MED ORDER — TRAMADOL HCL 50 MG PO TABS: ORAL_TABLET | ORAL | Status: DC

## 2015-09-07 ENCOUNTER — Encounter: Payer: Self-pay | Admitting: Family Medicine

## 2015-09-07 ENCOUNTER — Ambulatory Visit (INDEPENDENT_AMBULATORY_CARE_PROVIDER_SITE_OTHER): Payer: Managed Care, Other (non HMO) | Admitting: Family Medicine

## 2015-09-07 VITALS — BP 122/84 | HR 109 | Temp 98.3°F | Resp 18 | Wt 160.5 lb

## 2015-09-07 DIAGNOSIS — Z0189 Encounter for other specified special examinations: Secondary | ICD-10-CM

## 2015-09-07 DIAGNOSIS — Z0000 Encounter for general adult medical examination without abnormal findings: Secondary | ICD-10-CM

## 2015-09-07 DIAGNOSIS — G4700 Insomnia, unspecified: Secondary | ICD-10-CM | POA: Diagnosis not present

## 2015-09-07 MED ORDER — TRAZODONE HCL 50 MG PO TABS: 25.0000 mg | ORAL_TABLET | Freq: Every evening | ORAL | Status: DC | PRN
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
## 2015-09-07 NOTE — Patient Instructions (Addendum)
Keep up a regular exercise program and make sure you are eating a healthy diet Try to eat 4 servings of dairy a day, or if you are lactose intolerant take a calcium with vitamin D daily.  Your vaccines are up to date.    Insomnia Insomnia is a sleep disorder that makes it difficult to fall asleep or to stay asleep. Insomnia can cause tiredness (fatigue), low energy, difficulty concentrating, mood swings, and poor performance at work or school.  There are three different ways to classify insomnia:  Difficulty falling asleep.  Difficulty staying asleep.  Waking up too early in the morning. Any type of insomnia can be long-term (chronic) or short-term (acute). Both are common. Short-term insomnia usually lasts for three months or less. Chronic insomnia occurs at least three times a week for longer than three months. CAUSES  Insomnia may be caused by another condition, situation, or substance, such as:  Anxiety.  Certain medicines.  Gastroesophageal reflux disease (GERD) or other gastrointestinal conditions.  Asthma or other breathing conditions.  Restless legs syndrome, sleep apnea, or other sleep disorders.  Chronic pain.  Menopause. This may include hot flashes.  Stroke.  Abuse of alcohol, tobacco, or illegal drugs.  Depression.  Caffeine.   Neurological disorders, such as Alzheimer disease.  An overactive thyroid (hyperthyroidism). The cause of insomnia may not be known. RISK FACTORS Risk factors for insomnia include:  Gender. Women are more commonly affected than men.  Age. Insomnia is more common as you get older.  Stress. This may involve your professional or personal life.  Income. Insomnia is more common in people with lower income.  Lack of exercise.   Irregular work schedule or night shifts.  Traveling between different time zones. SIGNS AND SYMPTOMS If you have insomnia, trouble falling asleep or trouble staying asleep is the main symptom. This  may lead to other symptoms, such as:  Feeling fatigued.  Feeling nervous about going to sleep.  Not feeling rested in the morning.  Having trouble concentrating.  Feeling irritable, anxious, or depressed. TREATMENT  Treatment for insomnia depends on the cause. If your insomnia is caused by an underlying condition, treatment will focus on addressing the condition. Treatment may also include:   Medicines to help you sleep.  Counseling or therapy.  Lifestyle adjustments. HOME CARE INSTRUCTIONS   Take medicines only as directed by your health care provider.  Keep regular sleeping and waking hours. Avoid naps.  Keep a sleep diary to help you and your health care provider figure out what could be causing your insomnia. Include:   When you sleep.  When you wake up during the night.  How well you sleep.   How rested you feel the next day.  Any side effects of medicines you are taking.  What you eat and drink.   Make your bedroom a comfortable place where it is easy to fall asleep:  Put up shades or special blackout curtains to block light from outside.  Use a white noise machine to block noise.  Keep the temperature cool.   Exercise regularly as directed by your health care provider. Avoid exercising right before bedtime.  Use relaxation techniques to manage stress. Ask your health care provider to suggest some techniques that may work well for you. These may include:  Breathing exercises.  Routines to release muscle tension.  Visualizing peaceful scenes.  Cut back on alcohol, caffeinated beverages, and cigarettes, especially close to bedtime. These can disrupt your sleep.  Do not   overeat or eat spicy foods right before bedtime. This can lead to digestive discomfort that can make it hard for you to sleep.  Limit screen use before bedtime. This includes:  Watching TV.  Using your smartphone, tablet, and computer.  Stick to a routine. This can help you  fall asleep faster. Try to do a quiet activity, brush your teeth, and go to bed at the same time each night.  Get out of bed if you are still awake after 15 minutes of trying to sleep. Keep the lights down, but try reading or doing a quiet activity. When you feel sleepy, go back to bed.  Make sure that you drive carefully. Avoid driving if you feel very sleepy.  Keep all follow-up appointments as directed by your health care provider. This is important. SEEK MEDICAL CARE IF:   You are tired throughout the day or have trouble in your daily routine due to sleepiness.  You continue to have sleep problems or your sleep problems get worse. SEEK IMMEDIATE MEDICAL CARE IF:   You have serious thoughts about hurting yourself or someone else.   This information is not intended to replace advice given to you by your health care provider. Make sure you discuss any questions you have with your health care provider.   Document Released: 09/23/2000 Document Revised: 06/17/2015 Document Reviewed: 06/27/2014 [employer] Interactive Patient Education 2016 [employer]  

## 2015-09-08 ENCOUNTER — Other Ambulatory Visit: Payer: Self-pay

## 2015-09-08 DIAGNOSIS — R062 Wheezing: Secondary | ICD-10-CM

## 2015-09-08 LAB — COMPLETE METABOLIC PANEL WITH GFR
ALBUMIN: 4.2 g/dL (ref 3.6–5.1)
ALK PHOS: 92 U/L (ref 33–115)
ALT: 17 U/L (ref 6–29)
AST: 16 U/L (ref 10–30)
BUN: 8 mg/dL (ref 7–25)
CALCIUM: 9.3 mg/dL (ref 8.6–10.2)
CO2: 28 mmol/L (ref 20–31)
CREATININE: 1.04 mg/dL (ref 0.50–1.10)
Chloride: 106 mmol/L (ref 98–110)
GFR, Est African American: 80 mL/min (ref >=60)
GFR, Est Non African American: 70 mL/min (ref >=60)
Glucose, Bld: 62 mg/dL — ABNORMAL LOW (ref 65–99)
POTASSIUM: 3.8 mmol/L (ref 3.5–5.3)
Sodium: 137 mmol/L (ref 135–146)
Total Bilirubin: 0.4 mg/dL (ref 0.2–1.2)
Total Protein: 6.8 g/dL (ref 6.1–8.1)

## 2015-09-08 LAB — HIV ANTIBODY (ROUTINE TESTING W REFLEX): HIV 1&2 Ab, 4th Generation: NONREACTIVE

## 2015-09-08 LAB — LIPID PANEL
CHOL/HDL RATIO: 3.8 ratio (ref <=5.0)
Cholesterol: 153 mg/dL (ref 125–200)
HDL: 40 mg/dL — ABNORMAL LOW (ref >=46)
LDL Cholesterol: 88 mg/dL (ref <130)
Triglycerides: 123 mg/dL (ref <150)
VLDL: 25 mg/dL (ref <30)

## 2015-09-08 MED ORDER — ALBUTEROL SULFATE HFA 108 (90 BASE) MCG/ACT IN AERS: 2.0000 | INHALATION_SPRAY | Freq: Four times a day (QID) | RESPIRATORY_TRACT | Status: DC | PRN

## 2015-10-20 ENCOUNTER — Ambulatory Visit: Payer: Managed Care, Other (non HMO) | Admitting: Family Medicine

## 2015-10-30 ENCOUNTER — Other Ambulatory Visit: Payer: Self-pay

## 2015-10-30 MED ORDER — TRAMADOL HCL 50 MG PO TABS: ORAL_TABLET | ORAL | Status: DC

## 2015-11-06 ENCOUNTER — Other Ambulatory Visit: Payer: Self-pay | Admitting: Sports Medicine

## 2015-11-08 ENCOUNTER — Other Ambulatory Visit: Payer: Self-pay | Admitting: Sports Medicine

## 2015-11-17 ENCOUNTER — Ambulatory Visit: Payer: Managed Care, Other (non HMO) | Admitting: Family Medicine

## 2015-11-18 ENCOUNTER — Ambulatory Visit (INDEPENDENT_AMBULATORY_CARE_PROVIDER_SITE_OTHER): Payer: Managed Care, Other (non HMO) | Admitting: Family Medicine

## 2015-11-18 ENCOUNTER — Encounter: Payer: Self-pay | Admitting: Family Medicine

## 2015-11-18 VITALS — BP 127/88 | HR 95 | Ht 65.0 in | Wt 164.1 lb

## 2015-11-18 DIAGNOSIS — T887XXA Unspecified adverse effect of drug or medicament, initial encounter: Secondary | ICD-10-CM

## 2015-11-18 DIAGNOSIS — G4700 Insomnia, unspecified: Secondary | ICD-10-CM

## 2015-11-18 DIAGNOSIS — M7918 Myalgia, other site: Secondary | ICD-10-CM

## 2015-11-18 DIAGNOSIS — R062 Wheezing: Secondary | ICD-10-CM | POA: Diagnosis not present

## 2015-11-18 DIAGNOSIS — T50905A Adverse effect of unspecified drugs, medicaments and biological substances, initial encounter: Secondary | ICD-10-CM

## 2015-11-18 DIAGNOSIS — M797 Fibromyalgia: Secondary | ICD-10-CM | POA: Diagnosis not present

## 2015-11-18 MED ORDER — TRAMADOL HCL 50 MG PO TABS: ORAL_TABLET | ORAL | Status: DC

## 2015-11-18 NOTE — Progress Notes (Signed)
   Subjective:    Patient ID: Brianna Mendoza, female    DOB: 10-Dec-1980, 35 y.o.   MRN: [id number]  HPI myofascial pain - she says the trazodone made her feel "weird" so she has been taking the tramadol at bedtime only  She plans on starting a supplement for energy. She has only had one flare with sever neck pain since I last saw her and she takes her muscle relaxer. She says when she gets really stressed she get pain and tightness in her neck.  She tries to take a muscle relaxer and relax when it happens.  Still gets some aching in her arms if she does too much.     Still gets occ wheezing.  She feels like her chest gets congested when she doesn't use her albuterol.    Insomnia - her sleep is better.  She is off the trazodone.    Review of Systems     Objective:   Physical Exam  Constitutional: She is oriented to person, place, and time. She appears well-developed and well-nourished.  HENT:  Head: Normocephalic and atraumatic.  Cardiovascular: Normal rate, regular rhythm and normal heart sounds.   Pulmonary/Chest: Effort normal and breath sounds normal.  Neurological: She is alert and oriented to person, place, and time.  Skin: Skin is warm and dry.  Psychiatric: She has a normal mood and affect. Her behavior is normal.          Assessment & Plan:  Myofascial pain syndrome - Overall she is doing well adn has made great strides. She really want to work on being more health and losing a few pounds.  Strongly encouraged ehr to work on exercise.  Recommend a gradual increase in [place]. Can start with walking for 10-15 min 2-3 time a week and work on her endurance.    Wheezing - plans spirometry to better evaluate for asthma.  she can schedule at her convenience.      Insomnia - doing much better overall.

## 2015-12-03 ENCOUNTER — Encounter: Payer: Self-pay | Admitting: Family Medicine

## 2015-12-03 ENCOUNTER — Ambulatory Visit (INDEPENDENT_AMBULATORY_CARE_PROVIDER_SITE_OTHER): Payer: Managed Care, Other (non HMO) | Admitting: Family Medicine

## 2015-12-03 VITALS — BP 133/91 | HR 91 | Temp 98.8°F | Ht 65.0 in | Wt 163.0 lb

## 2015-12-03 DIAGNOSIS — J111 Influenza due to unidentified influenza virus with other respiratory manifestations: Secondary | ICD-10-CM | POA: Diagnosis not present

## 2015-12-03 DIAGNOSIS — J029 Acute pharyngitis, unspecified: Secondary | ICD-10-CM | POA: Diagnosis not present

## 2015-12-03 LAB — POCT RAPID STREP A (OFFICE): Rapid Strep A Screen: NEGATIVE

## 2015-12-03 MED ORDER — OSELTAMIVIR PHOSPHATE 75 MG PO CAPS: 75.0000 mg | ORAL_CAPSULE | Freq: Two times a day (BID) | ORAL | Status: DC

## 2015-12-03 NOTE — Patient Instructions (Signed)

## 2015-12-03 NOTE — Progress Notes (Signed)
   Subjective:    Patient ID: Brianna Mendoza, female    DOB: April 17, 1981, 35 y.o.   MRN: [id number]  HPI URI sxs -runny nocse, cough and congestion sxs began yesterday she has been taking delsyum, IBU and drinking fluids. she stated that she and her daughter have been around ppl who have flu. She has been having sweats adn chills and report temperature at home.  She feels achy all over and feels like her skin is sore.  She c/o of extreme fatigue. No SOB or wheezing.     Review of Systems     Objective:   Physical Exam  Constitutional: She is oriented to person, place, and time. She appears well-developed and well-nourished.  HENT:  Head: Normocephalic and atraumatic.  Right Ear: External ear normal.  Left Ear: External ear normal.  Nose: Nose normal.  Mouth/Throat: Oropharynx is clear and moist.  TMs and canals are clear.   Eyes: Conjunctivae and EOM are normal. Pupils are equal, round, and reactive to light.  Neck: Neck supple. No thyromegaly present.  Cardiovascular: Normal rate, regular rhythm and normal heart sounds.   Pulmonary/Chest: Effort normal and breath sounds normal. She has no wheezes.  Lymphadenopathy:    She has no cervical adenopathy.  Neurological: She is alert and oriented to person, place, and time.  Skin: Skin is warm and dry.  Psychiatric: She has a normal mood and affect.          Assessment & Plan:  Influenza. Will tx with tamiflu. Call if not better in one week.  Hydrated.  Tylenol/IBU for pain relief.

## 2015-12-07 ENCOUNTER — Other Ambulatory Visit: Payer: Self-pay | Admitting: Family Medicine

## 2015-12-10 ENCOUNTER — Encounter: Payer: Self-pay | Admitting: Family Medicine

## 2015-12-10 ENCOUNTER — Ambulatory Visit (INDEPENDENT_AMBULATORY_CARE_PROVIDER_SITE_OTHER): Payer: Managed Care, Other (non HMO) | Admitting: Family Medicine

## 2015-12-10 ENCOUNTER — Ambulatory Visit (INDEPENDENT_AMBULATORY_CARE_PROVIDER_SITE_OTHER): Payer: Managed Care, Other (non HMO)

## 2015-12-10 VITALS — BP 136/87 | HR 93 | Ht 63.0 in | Wt 163.0 lb

## 2015-12-10 DIAGNOSIS — T50905A Adverse effect of unspecified drugs, medicaments and biological substances, initial encounter: Secondary | ICD-10-CM

## 2015-12-10 DIAGNOSIS — R062 Wheezing: Secondary | ICD-10-CM | POA: Diagnosis not present

## 2015-12-10 DIAGNOSIS — M791 Myalgia: Secondary | ICD-10-CM

## 2015-12-10 DIAGNOSIS — Z13820 Encounter for screening for osteoporosis: Secondary | ICD-10-CM

## 2015-12-10 DIAGNOSIS — G8929 Other chronic pain: Secondary | ICD-10-CM

## 2015-12-10 DIAGNOSIS — Z720 Tobacco use: Secondary | ICD-10-CM

## 2015-12-10 DIAGNOSIS — M7918 Myalgia, other site: Secondary | ICD-10-CM

## 2015-12-10 MED ORDER — BENZONATATE 200 MG PO CAPS: 200.0000 mg | ORAL_CAPSULE | Freq: Two times a day (BID) | ORAL | Status: DC | PRN

## 2015-12-10 MED ORDER — ALBUTEROL SULFATE (2.5 MG/3ML) 0.083% IN NEBU
2.5000 mg | INHALATION_SOLUTION | Freq: Once | RESPIRATORY_TRACT | Status: AC
  Administered 2015-12-10: 2.5 mg via RESPIRATORY_TRACT

## 2015-12-10 MED ORDER — ALBUTEROL SULFATE (2.5 MG/3ML) 0.083% IN NEBU: 2.5000 mg | INHALATION_SOLUTION | Freq: Once | RESPIRATORY_TRACT | Status: DC

## 2015-12-10 MED ORDER — IBUPROFEN 800 MG PO TABS: 800.0000 mg | ORAL_TABLET | Freq: Three times a day (TID) | ORAL | Status: DC | PRN

## 2015-12-10 NOTE — Progress Notes (Signed)
   Subjective:    Patient ID: Brianna Mendoza, female    DOB: [birth year], 35 y.o.   MRN: [id number]  HPI  Here today for spirometry. She had a recent viral illness where she was short of breath and was concerned about her pulmonary function. She has been given albuterol inhalers in the past for wheezing but has never been formally evaluated.  Follow up myofascial pain syndrome-She has been trying to wean her tramadol on the weekend.  She would like a refill on her IBU.  He has not used it recently but would like to try using ibuprofen instead of the tramadol at times.  Smoking - still smoking about 5 cig per day.  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    Review of Systems     Objective:   Physical Exam  Constitutional: She is oriented to person, place, and time. She appears well-developed and well-nourished.  HENT:  Head: Normocephalic and atraumatic.  Eyes: Conjunctivae and EOM are normal.  Cardiovascular: Normal rate.   Pulmonary/Chest: Effort normal.  Neurological: She is alert and oriented to person, place, and time.  Skin: Skin is dry. No pallor.  Psychiatric: She has a normal mood and affect. Her behavior is normal.  Vitals reviewed.         Assessment & Plan:  Spirometry today was fairly normal. FVC of 85% and FEV1 89% with a ratio of 89%. Effort was poor to fair. She had 11 attempts pre-bronchodilator and 17 intense postbronchodilator. She did improve by 84% with her FVC. I suspect that this is just from performing the test better. But no significant improvement in FEV1. We discussed that she can continue to keep her albuterol inhaler and just use it when needed but this does not formalize a diagnosis of asthma for her. Recommend repeat spirometry in one year.  Diffuse myofascial pain syndrome-continue to work on weaning down the use of the tramadol. I did go ahead and refill her ibuprofen today.  Tobacco abuse-encourage cessation. Discussed strategies to do  so.

## 2016-02-03 ENCOUNTER — Other Ambulatory Visit: Payer: Self-pay | Admitting: Family Medicine

## 2016-02-03 ENCOUNTER — Other Ambulatory Visit: Payer: Self-pay | Admitting: Sports Medicine

## 2016-02-08 ENCOUNTER — Encounter: Payer: Self-pay | Admitting: Family Medicine

## 2016-02-08 ENCOUNTER — Ambulatory Visit (INDEPENDENT_AMBULATORY_CARE_PROVIDER_SITE_OTHER): Payer: Managed Care, Other (non HMO) | Admitting: Family Medicine

## 2016-02-08 ENCOUNTER — Other Ambulatory Visit: Payer: Self-pay | Admitting: Sports Medicine

## 2016-02-08 VITALS — BP 134/84 | HR 86 | Wt 165.0 lb

## 2016-02-08 DIAGNOSIS — M797 Fibromyalgia: Secondary | ICD-10-CM | POA: Diagnosis not present

## 2016-02-08 DIAGNOSIS — M7918 Myalgia, other site: Secondary | ICD-10-CM

## 2016-02-08 DIAGNOSIS — G4719 Other hypersomnia: Secondary | ICD-10-CM

## 2016-02-08 MED ORDER — TRAMADOL HCL 50 MG PO TABS: ORAL_TABLET | ORAL | Status: DC

## 2016-02-08 NOTE — Progress Notes (Signed)
   Subjective:    Patient ID: Brianna Mendoza, female    DOB: [birth year], 35 y.o.   MRN: [id number]  HPI Diffuse myofascial pain syndrome - she is doing wwell but needa  Refill on her tramadol.  She is also on her Lyrica and tolerating it well without any problems or side effects. She's recently joined the gym and has been walking her dog and says she's been trying to get more fruits and vegetables in her diet.  She also complains of HA and sleepy for 15-20 min episodes. Can happen at anytime.   He says it initially start with a low-grade headache and she'll suddenly feel sleepy. It's usually when she is more sedentary her working on her computer. It can happen in the morning or in the afternoon but it is brief period and then it seems to pass. She denies poor sleep quality at night. She says some nights she doesn't sleep well but overall she feels like it's good. She also denies any excessive snoring.  Review of Systems     Objective:   Physical Exam  Constitutional: She is oriented to person, place, and time. She appears well-developed and well-nourished.  HENT:  Head: Normocephalic and atraumatic.  Cardiovascular: Normal rate, regular rhythm and normal heart sounds.   Pulmonary/Chest: Effort normal and breath sounds normal.  Neurological: She is alert and oriented to person, place, and time.  Skin: Skin is warm and dry.  Psychiatric: She has a normal mood and affect. Her behavior is normal.       Assessment & Plan:  Myofascial pain syndrome - And overall she is doing well. She's made some dietary changes and plans on starting to get more regular exercise. We discussed the importance of graded exercise so that she doesn't overdo it.  Episodes of sleepiness-I really suspect that what she is describing is fairly normal. I don't see any signs of leak to property or excessive snoring to indicate possible sleep apnea. Certainly the medications that she is taking including the Lyrica and  tramadol can be sedating.

## 2016-03-10 ENCOUNTER — Other Ambulatory Visit: Payer: Self-pay | Admitting: Family Medicine

## 2016-04-05 ENCOUNTER — Other Ambulatory Visit: Payer: Self-pay | Admitting: Family Medicine

## 2016-04-06 ENCOUNTER — Other Ambulatory Visit: Payer: Self-pay | Admitting: Family Medicine

## 2016-04-07 ENCOUNTER — Other Ambulatory Visit: Payer: Self-pay

## 2016-04-07 MED ORDER — TRAMADOL HCL 50 MG PO TABS: 50.0000 mg | ORAL_TABLET | Freq: Three times a day (TID) | ORAL | Status: DC | PRN

## 2016-04-19 ENCOUNTER — Encounter: Payer: Self-pay | Admitting: Sports Medicine

## 2016-04-19 ENCOUNTER — Ambulatory Visit (INDEPENDENT_AMBULATORY_CARE_PROVIDER_SITE_OTHER): Payer: Managed Care, Other (non HMO) | Admitting: Sports Medicine

## 2016-04-19 VITALS — BP 139/89 | HR 99 | Resp 18 | Wt 166.6 lb

## 2016-04-19 DIAGNOSIS — S93402D Sprain of unspecified ligament of left ankle, subsequent encounter: Secondary | ICD-10-CM | POA: Diagnosis not present

## 2016-04-19 DIAGNOSIS — M797 Fibromyalgia: Secondary | ICD-10-CM | POA: Diagnosis not present

## 2016-04-19 DIAGNOSIS — M7918 Myalgia, other site: Secondary | ICD-10-CM

## 2016-04-19 DIAGNOSIS — S93402A Sprain of unspecified ligament of left ankle, initial encounter: Secondary | ICD-10-CM | POA: Insufficient documentation

## 2016-04-19 NOTE — Assessment & Plan Note (Signed)
Stable on current doses of Lyrica and Effexor. Massive amounts of reassurance given.

## 2016-04-19 NOTE — Progress Notes (Signed)
  Subjective:    CC: Follow-up  HPI: About a week ago this pleasant 35 year old female injured her left ankle, she was [facility] and had x-rays done of the foot and ankle which were negative, she was given an ASO, symptoms are improving.  Past medical history, Surgical history, Family history not pertinant except as noted below, Social history, Allergies, and medications have been entered into the medical record, reviewed, and no changes needed.   Review of Systems: No fevers, chills, night sweats, weight loss, chest pain, or shortness of breath.   Objective:    General: Well Developed, well nourished, and in no acute distress.  Neuro: Alert and oriented x3, extra-ocular muscles intact, sensation grossly intact.  HEENT: Normocephalic, atraumatic, pupils equal round reactive to light, neck supple, no masses, no lymphadenopathy, thyroid nonpalpable.  Skin: Warm and dry, no rashes. Cardiac: Regular rate and rhythm, no murmurs rubs or gallops, no lower extremity edema.  Respiratory: Clear to auscultation bilaterally. Not using accessory muscles, speaking in full sentences. Left Ankle: No visible erythema or swelling. Range of motion is full in all directions. Strength is 5/5 in all directions. Stable lateral and medial ligaments; squeeze test and kleiger test unremarkable; tender to palpation over the ATFL [place]ar dome nontender; No pain at base of 5th MT; No tenderness over cuboid; No tenderness over N spot or navicular prominence No tenderness on posterior aspects of lateral and medial malleolus No sign of peroneal tendon subluxations; Negative tarsal tunnel tinel's Able to walk 4 steps.  Impression and Recommendations:    I spent 25 minutes with this patient, greater than 50% was face-to-face time counseling regarding the above diagnoses

## 2016-04-19 NOTE — Assessment & Plan Note (Signed)
Stable exam, tender over the ATFL as expected, continue ASO brace, rehabilitation exercises, return as needed.

## 2016-05-03 ENCOUNTER — Other Ambulatory Visit: Payer: Self-pay | Admitting: Family Medicine

## 2016-05-03 ENCOUNTER — Other Ambulatory Visit: Payer: Self-pay | Admitting: Sports Medicine

## 2016-05-10 ENCOUNTER — Ambulatory Visit: Payer: Managed Care, Other (non HMO) | Admitting: Family Medicine

## 2016-05-16 ENCOUNTER — Ambulatory Visit (INDEPENDENT_AMBULATORY_CARE_PROVIDER_SITE_OTHER): Payer: Managed Care, Other (non HMO) | Admitting: Family Medicine

## 2016-05-16 ENCOUNTER — Encounter: Payer: Self-pay | Admitting: Family Medicine

## 2016-05-16 VITALS — BP 123/86 | HR 95 | Wt 163.0 lb

## 2016-05-16 DIAGNOSIS — S93402D Sprain of unspecified ligament of left ankle, subsequent encounter: Secondary | ICD-10-CM

## 2016-05-16 DIAGNOSIS — M7918 Myalgia, other site: Secondary | ICD-10-CM

## 2016-05-16 DIAGNOSIS — I10 Essential (primary) hypertension: Secondary | ICD-10-CM

## 2016-05-16 DIAGNOSIS — F411 Generalized anxiety disorder: Secondary | ICD-10-CM | POA: Diagnosis not present

## 2016-05-16 DIAGNOSIS — M797 Fibromyalgia: Secondary | ICD-10-CM | POA: Diagnosis not present

## 2016-05-16 NOTE — Progress Notes (Signed)
Subjective:    CC: Anxiety   HPI:  GAD -  She is here today to follow-up on her anxiety. She does have a history of panic attacks and is currently on venlafaxine 150 mg daily..  Hypertension - Pt denies chest pain, SOB, dizziness, or heart palpitations.  Taking meds as directed w/o problems.  Denies medication side effects.    Diffuse myofascial pain syndrome-overall she is doing really well. She's had 2 flares since I last saw her. One of them with stress-induced. Seem to be triggered by an argument with her sister.  She still having some left ankle pain. She was seen about 3 and half weeks ago [facility] sports medicine provider for a left ankle strain. He recommended an ASO and exercises. He does feel like it is getting better but it's just taking a while. She occasionally gets sharp pain. She's been using Epsom salts soaks and an over-the-counter rub.   Past medical history, Surgical history, Family history not pertinant except as noted below, Social history, Allergies, and medications have been entered into the medical record, reviewed, and corrections made.   Review of Systems: No fevers, chills, night sweats, weight loss, chest pain, or shortness of breath.   Objective:    General: Well Developed, well nourished, and in no acute distress.  Neuro: Alert and oriented x3, extra-ocular muscles intact, sensation grossly intact.  HEENT: Normocephalic, atraumatic  Skin: Warm and dry, no rashes. Cardiac: Regular rate and rhythm, no murmurs rubs or gallops, no lower extremity edema.  Respiratory: Clear to auscultation bilaterally. Not using accessory muscles, speaking in full sentences. Ext: Left ankle with no increased laxity. Normal range of motion movement and strength is 5 out of 5 in all directions. She is still tender along the anterior edge of the lateral malleolus.   Impression and Recommendations:   Hypertension- Well controlled. Continue current regimen. Follow up in  6 mo.    Anxiety - GAD-7 score of  9 today. We discussed options. Will continue current regimen for now. A lot of her stressors will hopefully improve as she finishes at school this fall. We can see her back in 6 months and potentially hopefully wean her medication at that time.  Left ankle strain-continue with stretches and rehabilitation exercises. If not continuing to improve over the next 2-3 weeks and please give us a call back and we'll do an x-ray at that time. Can use an anti-and plan for such as ibuprofen 800 mg as needed and can even use 2 extra strength Tylenol top of that for pain relief. Recommend not using any type of narcotic or schedule drug pain reliever at this time.  Chronic myofascial pain-right now she is doing well and is at baseline. She has had a couple flares since I last saw her. Continue to work on regular exercise once her ankle is healed. Reminded her that this is really important to her pain process.

## 2016-06-08 ENCOUNTER — Other Ambulatory Visit: Payer: Self-pay | Admitting: Family Medicine

## 2016-07-10 ENCOUNTER — Other Ambulatory Visit: Payer: Self-pay | Admitting: Sports Medicine

## 2016-07-10 ENCOUNTER — Other Ambulatory Visit: Payer: Self-pay | Admitting: Family Medicine

## 2016-07-21 ENCOUNTER — Ambulatory Visit (INDEPENDENT_AMBULATORY_CARE_PROVIDER_SITE_OTHER): Payer: Managed Care, Other (non HMO) | Admitting: Family Medicine

## 2016-07-21 DIAGNOSIS — G43009 Migraine without aura, not intractable, without status migrainosus: Secondary | ICD-10-CM | POA: Diagnosis not present

## 2016-07-21 DIAGNOSIS — G43909 Migraine, unspecified, not intractable, without status migrainosus: Secondary | ICD-10-CM | POA: Insufficient documentation

## 2016-07-21 DIAGNOSIS — H65191 Other acute nonsuppurative otitis media, right ear: Secondary | ICD-10-CM | POA: Diagnosis not present

## 2016-07-21 MED ORDER — PREDNISONE 20 MG PO TABS: 40.0000 mg | ORAL_TABLET | Freq: Every day | ORAL | 0 refills | Status: DC

## 2016-07-21 NOTE — Progress Notes (Signed)
Subjective:    CC: Migraine - acute visit  HPI: Migraines - started on tuesday only R sided lasted all day she stated that she took excedrin, no headache yesterday she has a slight headache today. She has been under a lot of stress recently. 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 She says she occasionally takes ibuprofen for the migraines and sometimes it helps but not consistently. She says this time it was severe to the point where she had to turn around go home. She was on her way to work. She says it triggered muscle spasms in her neck similar to when she is having flares with her fibromyalgia. She is actually sleeping much better. She cut out her caffeine significantly and has been working on a good sleep regimen and says that has really helped. She did try decreasing her Lyrica recently but did not do well and so has started back on twice a day dosing. No recent URI but felt like was getting a URI.   Past medical history, Surgical history, Family history not pertinant except as noted below, Social history, Allergies, and medications have been entered into the medical record, reviewed, and corrections made.   Review of Systems: No fevers, chills, night sweats, weight loss, chest pain, or shortness of breath.   Objective:    General: Well Developed, well nourished, and in no acute distress.  Neuro: Alert and oriented x3, extra-ocular muscles intact, sensation grossly intact.  HEENT: Normocephalic, atraumatic, OP is clear,  Bilat canals are clear.  Right TM shows some effusion.  Able to visualize the ossicle. Left TM is clear.  Skin: Warm and dry, no rashes. Cardiac: Regular rate and rhythm, no murmurs rubs or gallops, no lower extremity edema.  Respiratory: Clear to auscultation bilaterally. Not using accessory muscles, speaking in full sentences.   Impression and Recommendations:    Right inner ear effusion-we'll treat with oral prednisone. Most likely  triggered by allergies. No other signs of infection at this point in time. Certain if it's not improving or getting worse then please let us know.  Headaches-Renaldo is going to work on trying to get her ear better and I think that will help some with her headaches. Continue to work on making sure getting adequate sleep healthy diet and regular exercise. Consider prophylaxis if headaches continue.

## 2016-07-22 ENCOUNTER — Telehealth: Payer: Self-pay

## 2016-07-22 NOTE — Telephone Encounter (Signed)
Patient advised of recommendations.  

## 2016-07-22 NOTE — Telephone Encounter (Signed)
Yes, we got the info for the Depo. They faxed it right over. I would recommend she try a decongestant.  It is likely pressure in her sinuses causing her pain.

## 2016-07-22 NOTE — Telephone Encounter (Signed)
Brianna Mendoza called and stated she is having pain in her face and ears. She would like a pain medication sent in. She also asked if we received the paperwork for the Depo shot. Please advise.

## 2016-07-26 ENCOUNTER — Ambulatory Visit (INDEPENDENT_AMBULATORY_CARE_PROVIDER_SITE_OTHER): Payer: Managed Care, Other (non HMO) | Admitting: Family Medicine

## 2016-07-26 VITALS — BP 127/85 | HR 89 | Wt 166.0 lb

## 2016-07-26 DIAGNOSIS — Z3042 Encounter for surveillance of injectable contraceptive: Secondary | ICD-10-CM | POA: Diagnosis not present

## 2016-07-26 MED ORDER — MEDROXYPROGESTERONE ACETATE 150 MG/ML IM SUSP
150.0000 mg | Freq: Once | INTRAMUSCULAR | Status: AC
  Administered 2016-07-26: 150 mg via INTRAMUSCULAR

## 2016-07-26 NOTE — Progress Notes (Signed)
Agree with above.  Brianna Metheney, MD  

## 2016-07-26 NOTE — Progress Notes (Signed)
Patient came into clinic today for depo-provera shot. Patient reports no complications from injection, states she does feel "crampy" but that is normal when its time for her next injection. Patient was seen in clinic last week for some ear pain, states she finishes her Prednisone today and her ear feels better. Patient tolerated injection in  Left deltoid well, no immediate complications. Patient was supposed to get injection in Right deltoid this week but just received the flu shot at work so request it be administered in other arm. Will update Pt's chart regarding flu shot. While in clinic Patient states she is due for her annual exam and her daughter is due for a well child check, she will call clinic to schedule sometime in December. Depo calender given to Pt prior to leaving clinic, she will call to schedule next injection in January. No further questions/concerns at this time.

## 2016-07-27 ENCOUNTER — Other Ambulatory Visit: Payer: Self-pay

## 2016-07-27 NOTE — Telephone Encounter (Signed)
Ok for rx for massage therapy

## 2016-07-27 NOTE — Telephone Encounter (Signed)
Pt stated that the pain in her back and neck is getting worse. She is asking for a Rx for massage therapy so that she could use her flex spending account to pay for it. Please advise.

## 2016-07-28 MED ORDER — AMBULATORY NON FORMULARY MEDICATION: 0 refills | Status: DC

## 2016-07-29 ENCOUNTER — Encounter: Payer: Self-pay | Admitting: Sports Medicine

## 2016-07-29 ENCOUNTER — Telehealth: Payer: Self-pay | Admitting: Family Medicine

## 2016-07-29 ENCOUNTER — Ambulatory Visit (INDEPENDENT_AMBULATORY_CARE_PROVIDER_SITE_OTHER): Payer: Managed Care, Other (non HMO) | Admitting: Sports Medicine

## 2016-07-29 DIAGNOSIS — M5030 Other cervical disc degeneration, unspecified cervical region: Secondary | ICD-10-CM

## 2016-07-29 DIAGNOSIS — M791 Myalgia: Secondary | ICD-10-CM

## 2016-07-29 DIAGNOSIS — H938X2 Other specified disorders of left ear: Secondary | ICD-10-CM

## 2016-07-29 DIAGNOSIS — M7918 Myalgia, other site: Secondary | ICD-10-CM

## 2016-07-29 MED ORDER — CYCLOBENZAPRINE HCL 10 MG PO TABS: ORAL_TABLET | ORAL | 0 refills | Status: DC

## 2016-07-29 NOTE — Progress Notes (Signed)
  Subjective:    CC: Neck and back pain  HPI: This is a 35 year old female with known fibromyalgia, previously well controlled on Effexor and Lyrica. Unfortunately she is having a flare with increased neck and back pain, tightness, sensation of full body swelling. Symptoms are severe, persistent and she desires injectable treatment today. Nothing overtly radicular.  Past medical history:  Negative.  See flowsheet/record as well for more information.  Surgical history: Negative.  See flowsheet/record as well for more information.  Family history: Negative.  See flowsheet/record as well for more information.  Social history: Negative.  See flowsheet/record as well for more information.  Allergies, and medications have been entered into the medical record, reviewed, and no changes needed.   Review of Systems: No fevers, chills, night sweats, weight loss, chest pain, or shortness of breath.   Objective:    General: Well Developed, well nourished, and in no acute distress.  Neuro: Alert and oriented x3, extra-ocular muscles intact, sensation grossly intact.  HEENT: Normocephalic, atraumatic, pupils equal round reactive to light, neck supple, no masses, no lymphadenopathy, thyroid nonpalpable.  Skin: Warm and dry, no rashes. Cardiac: Regular rate and rhythm, no murmurs rubs or gallops, no lower extremity edema.  Respiratory: Clear to auscultation bilaterally. Not using accessory muscles, speaking in full sentences. Neck: Negative spurling's Full neck range of motion Grip strength and sensation normal in bilateral hands Strength good C4 to T1 distribution No sensory change to C4 to T1 Reflexes normal  Tender to palpation over the left trapezius. Also some tenderness over the rhomboids on the left side.  Procedure:  Injection of #3 left trapezial and rhomboid trigger points Consent obtained and verified. Time-out conducted. Noted no overlying erythema, induration, or other signs of local  infection. Skin prepped in a sterile fashion. Topical analgesic spray: Ethyl chloride. Completed without difficulty. Meds: A total of 1 mL kenalog 40, 2 mL lidocaine, 2 mL Marcaine was productive between the 3 trigger point. Pain immediately improved suggesting accurate placement of the medication. Advised to call if fevers/chills, erythema, induration, drainage, or persistent bleeding.   Impression and Recommendations:    Diffuse myofascial pain syndrome Continue Effexor and Lyrica. Adding a short course of Flexeril. Trigger point injections on the left trapezius. Return to see me to go over MRI results.  DDD (degenerative disc disease), cervical Continue Effexor and Lyrica. Adding a short course of Flexeril. Trigger point injections on the left trapezius. Return to see me to go over MRI results.  I spent 25 minutes with this patient, greater than 50% was face-to-face time counseling regarding the above diagnoses

## 2016-07-29 NOTE — Telephone Encounter (Signed)
Ordered the referral to ENT. Left message advising patient of recommendations.

## 2016-07-29 NOTE — Assessment & Plan Note (Signed)
Continue Effexor and Lyrica. Adding a short course of Flexeril. Trigger point injections on the left trapezius. Return to see me to go over MRI results. 

## 2016-07-29 NOTE — Telephone Encounter (Signed)
Let refer her to ENT.  Ok to place referral. If the prednisone didn't work and we need to take a closer look at what might be going on. If everything that she can try is an antihistamine such as Claritin or Zyrtec if she tolerates that is okay.

## 2016-07-29 NOTE — Telephone Encounter (Signed)
Ms. Brianna Mendoza said the fluid has moved to the left ear and the prednisone didn't work she has completed all the doses of that medicine and would like something else prescribed that can help

## 2016-07-29 NOTE — Assessment & Plan Note (Signed)
Continue Effexor and Lyrica. Adding a short course of Flexeril. Trigger point injections on the left trapezius. Return to see me to go over MRI results.

## 2016-08-04 ENCOUNTER — Ambulatory Visit (INDEPENDENT_AMBULATORY_CARE_PROVIDER_SITE_OTHER): Payer: Managed Care, Other (non HMO)

## 2016-08-04 ENCOUNTER — Telehealth: Payer: Self-pay | Admitting: Sports Medicine

## 2016-08-04 DIAGNOSIS — M7918 Myalgia, other site: Secondary | ICD-10-CM

## 2016-08-04 DIAGNOSIS — M79602 Pain in left arm: Secondary | ICD-10-CM

## 2016-08-04 DIAGNOSIS — M542 Cervicalgia: Secondary | ICD-10-CM | POA: Diagnosis not present

## 2016-08-04 DIAGNOSIS — M79601 Pain in right arm: Secondary | ICD-10-CM

## 2016-08-04 DIAGNOSIS — M5030 Other cervical disc degeneration, unspecified cervical region: Secondary | ICD-10-CM

## 2016-08-04 NOTE — Telephone Encounter (Signed)
I just sent a result note to Brianna Mendoza, the cervical spine MRI was completely negative, her pain is fibromyalgia.  Give the Flexeril some time, I'm also going to add formal physical therapy.  Physical therapy will be uncomfortable but she has no structural defect in her cervical spine or shoulders, and should push through it.

## 2016-08-04 NOTE — Telephone Encounter (Signed)
Patient called to let you know that she had her CT scan this morning and she is still in pain. I told her I could set up an appointment for her, but she wanted to see what you said first. Thanks!

## 2016-08-05 ENCOUNTER — Other Ambulatory Visit: Payer: Self-pay | Admitting: Sports Medicine

## 2016-08-14 ENCOUNTER — Other Ambulatory Visit: Payer: Self-pay | Admitting: Family Medicine

## 2016-08-18 ENCOUNTER — Ambulatory Visit: Payer: Managed Care, Other (non HMO) | Admitting: Physical Therapy

## 2016-08-25 ENCOUNTER — Encounter: Payer: Managed Care, Other (non HMO) | Admitting: Physical Therapy

## 2016-08-31 ENCOUNTER — Ambulatory Visit (INDEPENDENT_AMBULATORY_CARE_PROVIDER_SITE_OTHER): Payer: Managed Care, Other (non HMO) | Admitting: Sports Medicine

## 2016-08-31 ENCOUNTER — Encounter: Payer: Self-pay | Admitting: Sports Medicine

## 2016-08-31 DIAGNOSIS — M791 Myalgia: Secondary | ICD-10-CM | POA: Diagnosis not present

## 2016-08-31 DIAGNOSIS — M7918 Myalgia, other site: Secondary | ICD-10-CM

## 2016-08-31 MED ORDER — TRAMADOL HCL 50 MG PO TABS: ORAL_TABLET | ORAL | 0 refills | Status: DC

## 2016-08-31 MED ORDER — PREGABALIN 150 MG PO CAPS: 150.0000 mg | ORAL_CAPSULE | Freq: Two times a day (BID) | ORAL | 0 refills | Status: DC

## 2016-08-31 NOTE — Progress Notes (Signed)
  Subjective:    CC: follow-up  HPI: Fibromyalgia: Flare has improved with doubling Lyrica for a month.  She did complain of some ankle pain which has since resolved.  Past medical history:  Negative.  See flowsheet/record as well for more information.  Surgical history: Negative.  See flowsheet/record as well for more information.  Family history: Negative.  See flowsheet/record as well for more information.  Social history: Negative.  See flowsheet/record as well for more information.  Allergies, and medications have been entered into the medical record, reviewed, and no changes needed.   Review of Systems: No fevers, chills, night sweats, weight loss, chest pain, or shortness of breath.   Objective:    General: Well Developed, well nourished, and in no acute distress.  Neuro: Alert and oriented x3, extra-ocular muscles intact, sensation grossly intact.  HEENT: Normocephalic, atraumatic, pupils equal round reactive to light, neck supple, no masses, no lymphadenopathy, thyroid nonpalpable.  Skin: Warm and dry, no rashes. Cardiac: Regular rate and rhythm, no murmurs rubs or gallops, no lower extremity edema.  Respiratory: Clear to auscultation bilaterally. Not using accessory muscles, speaking in full sentences.  Impression and Recommendations:    Diffuse myofascial pain syndrome Continue Effexor and Lyrica, needs refills. Refilling tramadol. Did well with trigger point injections at the last visit, cervical spine MRI was completely negative. Complaining of some bilateral ankle pain, she is going to do some rehabilitation exercises, return as needed.

## 2016-08-31 NOTE — Assessment & Plan Note (Signed)
Continue Effexor and Lyrica, needs refills. Refilling tramadol. Did well with trigger point injections at the last visit, cervical spine MRI was completely negative. Complaining of some bilateral ankle pain, she is going to do some rehabilitation exercises, return as needed.

## 2016-09-08 ENCOUNTER — Encounter: Payer: Managed Care, Other (non HMO) | Admitting: Physical Therapy

## 2016-09-30 ENCOUNTER — Other Ambulatory Visit: Payer: Self-pay | Admitting: Sports Medicine

## 2016-09-30 ENCOUNTER — Other Ambulatory Visit: Payer: Self-pay | Admitting: Family Medicine

## 2016-10-16 ENCOUNTER — Other Ambulatory Visit: Payer: Self-pay | Admitting: Sports Medicine

## 2016-10-26 ENCOUNTER — Ambulatory Visit: Payer: Managed Care, Other (non HMO)

## 2016-10-28 ENCOUNTER — Ambulatory Visit (INDEPENDENT_AMBULATORY_CARE_PROVIDER_SITE_OTHER): Payer: Managed Care, Other (non HMO) | Admitting: Family Medicine

## 2016-10-28 VITALS — BP 129/83 | HR 101

## 2016-10-28 DIAGNOSIS — Z3042 Encounter for surveillance of injectable contraceptive: Secondary | ICD-10-CM

## 2016-10-28 LAB — POCT URINE PREGNANCY: PREG TEST UR: NEGATIVE

## 2016-10-28 MED ORDER — MEDROXYPROGESTERONE ACETATE 150 MG/ML IM SUSP
150.0000 mg | Freq: Once | INTRAMUSCULAR | Status: AC
  Administered 2016-10-28: 150 mg via INTRAMUSCULAR

## 2016-10-28 NOTE — Progress Notes (Signed)
Agree with above.  Jevon Littlepage, MD  

## 2016-10-28 NOTE — Progress Notes (Signed)
Patient came into clinic today for depo-provera shot. Patient reports no complications from injection, states she does feel "crampy" but that is normal when its time for her next injection. Patient is 2 days behind schedule for depo administration due to office closing for bad weather. Per PCP, if Patient has not been sexually active in the past 10 days and has one negative pregnancy test she is OK to get injection today. Patient states she has not been sexually active in "months," POC UPT was negative. Patient request injection in left deltoid, states she is right handed and it is better when administered in non-dominant arm. Patient tolerated administration of depo well, no immediate complications. While in clinic Patient states she is due for her annual exam, scheduled this for April. Depo calender given to Pt prior to leaving clinic, next administration scheduled for April. No further questions/concerns at this time.

## 2016-11-02 ENCOUNTER — Other Ambulatory Visit: Payer: Self-pay | Admitting: Sports Medicine

## 2016-11-05 ENCOUNTER — Other Ambulatory Visit: Payer: Self-pay | Admitting: Sports Medicine

## 2016-11-16 ENCOUNTER — Encounter: Payer: Self-pay | Admitting: Family Medicine

## 2016-11-16 ENCOUNTER — Ambulatory Visit (INDEPENDENT_AMBULATORY_CARE_PROVIDER_SITE_OTHER): Payer: Managed Care, Other (non HMO) | Admitting: Family Medicine

## 2016-11-16 VITALS — BP 131/89 | HR 109 | Ht 63.0 in | Wt 167.0 lb

## 2016-11-16 DIAGNOSIS — F410 Panic disorder [episodic paroxysmal anxiety] without agoraphobia: Secondary | ICD-10-CM

## 2016-11-16 DIAGNOSIS — M7918 Myalgia, other site: Secondary | ICD-10-CM

## 2016-11-16 DIAGNOSIS — M791 Myalgia: Secondary | ICD-10-CM | POA: Diagnosis not present

## 2016-11-16 DIAGNOSIS — I10 Essential (primary) hypertension: Secondary | ICD-10-CM | POA: Diagnosis not present

## 2016-11-16 DIAGNOSIS — G43009 Migraine without aura, not intractable, without status migrainosus: Secondary | ICD-10-CM

## 2016-11-16 MED ORDER — HYDROCHLOROTHIAZIDE 12.5 MG PO CAPS: 12.5000 mg | ORAL_CAPSULE | Freq: Every day | ORAL | 3 refills | Status: DC

## 2016-11-16 NOTE — Progress Notes (Addendum)
Subjective:    CC: HTN  HPI:  Hypertension- Pt denies chest pain, SOB, dizziness, or heart palpitations.  Taking meds as directed w/o problems.  Denies medication side effects.    Fibromyalgia-overall she actually feels like she's been doing really well. She hasn't had any major flares. She said she actually tripped and fell while walking the dog a couple of weeks ago and had her abdomen and forearms and scraped up her right foot. She thought that that might trigger a flare but she said she was just sore for couple days and actually did well.  She is planning on quitting back to school to get an additional degree and substance abuse counseling. It will be a two-year program. She'll find out the next couple weeks whether or not she's been accepted into the program she is going to accept a more money  Migraine headaches-she said her last headache she had was yesterday. She says it started with a right-sided headache. She had it all day yesterday. She was trying to actually cut back on caffeine and thinks avoiding caffeine actually triggered the headache. She says I want she had to have a couple coffee. Says that because of the headache she didn't sleep well last night and still has a low-grade headache today. She feels like overall though she is trying to do better and eat more healthy.   Past medical history, Surgical history, Family history not pertinant except as noted below, Social history, Allergies, and medications have been entered into the medical record, reviewed, and corrections made.   Review of Systems: No fevers, chills, night sweats, weight loss, chest pain, or shortness of breath.   Objective:    General: Well Developed, well nourished, and in no acute distress.  Neuro: Alert and oriented x3, extra-ocular muscles intact, sensation grossly intact.  HEENT: Normocephalic, atraumatic  Skin: Warm and dry, no rashes. Cardiac: Regular rate and rhythm, no murmurs rubs or gallops, no  lower extremity edema.  Respiratory: Clear to auscultation bilaterally. Not using accessory muscles, speaking in full sentences.   Impression and Recommendations:    HTN - Well controlled. Continue current regimen. Follow up in  6 months.   Fibromyalgia-stable. No recent flares or exacerbations. We are continuing her Lyrica and Venlafaxine.    Migraine HA - continue current regimen. F/U in 6 months.   Anxiety with history of panic attacks-GAD 7 score of 2 today which absolutely is fantastic and she rates her symptoms as not difficult at all. Continue with Effexor.

## 2016-11-24 ENCOUNTER — Ambulatory Visit (INDEPENDENT_AMBULATORY_CARE_PROVIDER_SITE_OTHER): Payer: Managed Care, Other (non HMO) | Admitting: Family Medicine

## 2016-11-24 ENCOUNTER — Encounter: Payer: Self-pay | Admitting: Family Medicine

## 2016-11-24 VITALS — BP 132/96 | HR 97 | Temp 98.6°F | Wt 165.0 lb

## 2016-11-24 DIAGNOSIS — J320 Chronic maxillary sinusitis: Secondary | ICD-10-CM

## 2016-11-24 MED ORDER — AMOXICILLIN-POT CLAVULANATE 875-125 MG PO TABS: 1.0000 | ORAL_TABLET | Freq: Two times a day (BID) | ORAL | 0 refills | Status: DC

## 2016-11-24 NOTE — Progress Notes (Signed)
   Subjective:    Patient ID: Brianna Mendoza, female    DOB: 02/23/1981, 36 y.o.   MRN: [id number]  HPI 36 yo female comes in today complaining of vertigo and left ear pain today that started about 48 hours ago.  Started after a migraine. She does have some nasal congestion for a bout a week. . The pain does radiate to her face and jaw and teeth today.  She denies ST but she does have a cough that has started in the last 2 days.  She as been exposed to the flu. She thinks she had a fever about 2 nights ago. She says her left trapezius is tighter as well.   She also c/o of left ankle swellin and left knee swelling that started around the same time.    Review of Systems     Objective:   Physical Exam  Constitutional: She is oriented to person, place, and time. She appears well-developed and well-nourished.  HENT:  Head: Normocephalic and atraumatic.  Right Ear: External ear normal.  Left Ear: External ear normal.  Nose: Nose normal.  Mouth/Throat: Oropharynx is clear and moist.  TMs and canals are clear.   Eyes: Conjunctivae and EOM are normal. Pupils are equal, round, and reactive to light.  Neck: Neck supple. No thyromegaly present.  Cardiovascular: Normal rate, regular rhythm and normal heart sounds.   Pulmonary/Chest: Effort normal and breath sounds normal. She has no wheezes.  Lymphadenopathy:    She has no cervical adenopathy.  Neurological: She is alert and oriented to person, place, and time.  Skin: Skin is warm and dry.  Psychiatric: She has a normal mood and affect. Her behavior is normal.   Left ankle with some edema espe around the lateral ankle.      Assessment & Plan:  Left maxillary sinusitis - will tx with augmentin.  Call if not improving after about 5 days.  Symptomatic care recommend.   Left ankle swelling. See improves with illness. Keep elevated, compression.

## 2016-11-28 ENCOUNTER — Other Ambulatory Visit: Payer: Self-pay | Admitting: Sports Medicine

## 2016-12-12 ENCOUNTER — Other Ambulatory Visit: Payer: Self-pay | Admitting: Sports Medicine

## 2016-12-24 ENCOUNTER — Other Ambulatory Visit: Payer: Self-pay | Admitting: Sports Medicine

## 2017-01-04 ENCOUNTER — Other Ambulatory Visit: Payer: Self-pay | Admitting: Sports Medicine

## 2017-01-13 ENCOUNTER — Telehealth: Payer: Self-pay | Admitting: Sports Medicine

## 2017-01-13 ENCOUNTER — Ambulatory Visit (INDEPENDENT_AMBULATORY_CARE_PROVIDER_SITE_OTHER): Payer: Managed Care, Other (non HMO) | Admitting: Family Medicine

## 2017-01-13 VITALS — BP 129/85 | HR 95 | Ht 64.0 in | Wt 170.1 lb

## 2017-01-13 DIAGNOSIS — Z309 Encounter for contraceptive management, unspecified: Secondary | ICD-10-CM | POA: Diagnosis not present

## 2017-01-13 MED ORDER — MEDROXYPROGESTERONE ACETATE 150 MG/ML IM SUSP
150.0000 mg | Freq: Once | INTRAMUSCULAR | Status: AC
  Administered 2017-01-13: 150 mg via INTRAMUSCULAR

## 2017-01-13 NOTE — Telephone Encounter (Signed)
Patient came in for an appointment and requested I let you know that she is having right leg pain. She said you would know what she was talking about. She'll be following up with you asap. Thanks!

## 2017-01-13 NOTE — Progress Notes (Signed)
Agree with below. \Billy Rocco, MD  

## 2017-01-13 NOTE — Progress Notes (Signed)
   Subjective:    Patient ID: Brianna Mendoza, female    DOB: 1981/02/20, 36 y.o.   MRN: [id number]  HPI Pt is here for a Depo Injection. Pt denies chest pain, shortness of breath, and any medication problems. Pt denies pregnancy and stated that she has not been sexually active in months.    Review of Systems     Objective:   Physical Exam        Assessment & Plan:  Pt advised to schedule a nurse visit in 3 months.

## 2017-01-30 ENCOUNTER — Other Ambulatory Visit: Payer: Self-pay | Admitting: Sports Medicine

## 2017-02-01 ENCOUNTER — Other Ambulatory Visit: Payer: Self-pay | Admitting: Sports Medicine

## 2017-03-08 ENCOUNTER — Other Ambulatory Visit: Payer: Self-pay | Admitting: Family Medicine

## 2017-03-08 NOTE — Telephone Encounter (Signed)
Pt requesting refills.Brianna PacasBarkley, Brianna Noah CathcartLynetta

## 2017-03-14 ENCOUNTER — Other Ambulatory Visit: Payer: Self-pay | Admitting: Sports Medicine

## 2017-03-17 ENCOUNTER — Ambulatory Visit: Payer: Managed Care, Other (non HMO)

## 2017-03-17 ENCOUNTER — Encounter: Payer: Self-pay | Admitting: Sports Medicine

## 2017-03-17 ENCOUNTER — Ambulatory Visit (INDEPENDENT_AMBULATORY_CARE_PROVIDER_SITE_OTHER): Payer: Managed Care, Other (non HMO) | Admitting: Sports Medicine

## 2017-03-17 DIAGNOSIS — M791 Myalgia: Secondary | ICD-10-CM | POA: Diagnosis not present

## 2017-03-17 DIAGNOSIS — M7918 Myalgia, other site: Secondary | ICD-10-CM

## 2017-03-17 MED ORDER — PREGABALIN 300 MG PO CAPS: 300.0000 mg | ORAL_CAPSULE | Freq: Two times a day (BID) | ORAL | 0 refills | Status: DC

## 2017-03-17 NOTE — Progress Notes (Signed)
  Subjective:    CC: Follow-up  HPI: This is a 36 year old female, she has known fibromyalgia, we have controlled her symptoms well historically with Cymbalta and Lyrica. Occasionally she comes in with flares with widespread pain and depression and anxiety. In the past as well I have controlled her flares well with doubling her dose of Lyrica for a period of time. She's having similar symptoms today, and agrees to proceed with the previously documented treatment. Symptoms are worsening, moderate.  Past medical history:  Negative.  See flowsheet/record as well for more information.  Surgical history: Negative.  See flowsheet/record as well for more information.  Family history: Negative.  See flowsheet/record as well for more information.  Social history: Negative.  See flowsheet/record as well for more information.  Allergies, and medications have been entered into the medical record, reviewed, and no changes needed.   Review of Systems: No fevers, chills, night sweats, weight loss, chest pain, or shortness of breath.   Objective:    General: Well Developed, well nourished, and in no acute distress.  Neuro: Alert and oriented x3, extra-ocular muscles intact, sensation grossly intact.  HEENT: Normocephalic, atraumatic, pupils equal round reactive to light, neck supple, no masses, no lymphadenopathy, thyroid nonpalpable.  Skin: Warm and dry, no rashes. Cardiac: Regular rate and rhythm, no murmurs rubs or gallops, no lower extremity edema.  Respiratory: Clear to auscultation bilaterally. Not using accessory muscles, speaking in full sentences.  Impression and Recommendations:    Diffuse myofascial pain syndrome Fibromyalgia flare, this has happened several months ago, we doubled her Lyrica for a couple of weeks and then dropped back down, things worked well. Adding Lyrica 300 milligrams twice a day for 2 weeks, she will then drop back down to the 150. Per patient request out of work for one  month.  I spent 25 minutes with this patient, greater than 50% was face-to-face time counseling regarding the above diagnoses

## 2017-03-17 NOTE — Assessment & Plan Note (Signed)
Fibromyalgia flare, this has happened several months ago, we doubled her Lyrica for a couple of weeks and then dropped back down, things worked well. Adding Lyrica 300 milligrams twice a day for 2 weeks, she will then drop back down to the 150. Per patient request out of work for one month.

## 2017-03-20 ENCOUNTER — Ambulatory Visit: Payer: Managed Care, Other (non HMO) | Admitting: Sports Medicine

## 2017-03-21 ENCOUNTER — Telehealth: Payer: Self-pay | Admitting: Family Medicine

## 2017-03-21 MED ORDER — PROMETHAZINE HCL 25 MG PO TABS: 25.0000 mg | ORAL_TABLET | Freq: Four times a day (QID) | ORAL | 3 refills | Status: DC | PRN

## 2017-03-21 NOTE — Telephone Encounter (Signed)
Pt called. Needs FMLA paperwork completed asap because letter written by Dr didn't suffice.. She's still in pain(Ibuprophen only worked for a short) and wants  muscle relaxer also increase in dosage of med has caused nausea  and wants script for WashburnFinnergan.  Thanks a lot!

## 2017-03-21 NOTE — Telephone Encounter (Signed)
She will have to come in for FMLA.  Ibuprofen wasn't the thing to stop her pain, we doubled her lyrica.  Phenergan added, which muscle relaxer does she want?

## 2017-03-22 ENCOUNTER — Ambulatory Visit (INDEPENDENT_AMBULATORY_CARE_PROVIDER_SITE_OTHER): Payer: Managed Care, Other (non HMO) | Admitting: Sports Medicine

## 2017-03-22 DIAGNOSIS — M797 Fibromyalgia: Secondary | ICD-10-CM

## 2017-03-22 LAB — CBC
HCT: 42.1 % (ref 35.0–45.0)
Hemoglobin: 13.8 g/dL (ref 11.7–15.5)
MCH: 29.6 pg (ref 27.0–33.0)
MCHC: 32.8 g/dL (ref 32.0–36.0)
MCV: 90.1 fL (ref 80.0–100.0)
MPV: 10.5 fL (ref 7.5–12.5)
Platelets: 303 K/uL (ref 140–400)
RBC: 4.67 MIL/uL (ref 3.80–5.10)
RDW: 13.3 % (ref 11.0–15.0)
WBC: 4.2 K/uL (ref 3.8–10.8)

## 2017-03-22 LAB — TSH: TSH: 1.79 m[IU]/L

## 2017-03-22 LAB — CK: Total CK: 144 U/L — ABNORMAL HIGH (ref 29–143)

## 2017-03-22 MED ORDER — PREDNISONE 50 MG PO TABS: ORAL_TABLET | ORAL | 0 refills | Status: DC

## 2017-03-22 NOTE — Telephone Encounter (Signed)
Here for appointment

## 2017-03-22 NOTE — Assessment & Plan Note (Signed)
Currently in a flare, she did come back a little bit early. She will continue her double dose Lyrica for a full 2 weeks. Nausea has resolved. I'm going to add 5 days of prednisone burst and check some blood work including CK, ESR, CBC. Keep previously agreed upon follow-up, and she will have to bring in her FMLA paperwork to be filled out.

## 2017-03-22 NOTE — Progress Notes (Signed)
  Subjective:    CC:  Follow-up  HPI: Ms. Escutia returns, she has fibromyalgia, we increased her Lyrica the last visit, she unfortunately complains of persistent pain.   She also has some FMLA paperwork to fill out.  Past medical history:  Negative.  See flowsheet/record as well for more information.  Surgical history: Negative.  See flowsheet/record as well for more information.  Family history: Negative.  See flowsheet/record as well for more information.  Social history: Negative.  See flowsheet/record as well for more information.  Allergies, and medications have been entered into the medical record, reviewed, and no changes needed.   Review of Systems: No fevers, chills, night sweats, weight loss, chest pain, or shortness of breath.   Objective:    General: Well Developed, well nourished, and in no acute distress.  Neuro: Alert and oriented x3, extra-ocular muscles intact, sensation grossly intact.  HEENT: Normocephalic, atraumatic, pupils equal round reactive to light, neck supple, no masses, no lymphadenopathy, thyroid nonpalpable.  Skin: Warm and dry, no rashes. Cardiac: Regular rate and rhythm, no murmurs rubs or gallops, no lower extremity edema.  Respiratory: Clear to auscultation bilaterally. Not using accessory muscles, speaking in full sentences.  Impression and Recommendations:    Fibromyalgia Currently in a flare, she did come back a little bit early. She will continue her double dose Lyrica for a full 2 weeks. Nausea has resolved. I'm going to add 5 days of prednisone burst and check some blood work including CK, ESR, CBC. Keep previously agreed upon follow-up, and she will have to bring in her FMLA paperwork to be filled out.  I spent 25 minutes with this patient, greater than 50% was face-to-face time counseling regarding the above diagnoses

## 2017-03-23 ENCOUNTER — Ambulatory Visit: Payer: Managed Care, Other (non HMO) | Admitting: Sports Medicine

## 2017-03-23 LAB — SEDIMENTATION RATE: Sed Rate: 7 mm/hr (ref 0–20)

## 2017-03-28 ENCOUNTER — Other Ambulatory Visit: Payer: Self-pay | Admitting: Sports Medicine

## 2017-03-28 DIAGNOSIS — M7918 Myalgia, other site: Secondary | ICD-10-CM

## 2017-03-28 MED ORDER — PREGABALIN 300 MG PO CAPS: 300.0000 mg | ORAL_CAPSULE | Freq: Two times a day (BID) | ORAL | 0 refills | Status: DC

## 2017-03-31 ENCOUNTER — Telehealth: Payer: Self-pay | Admitting: Sports Medicine

## 2017-03-31 NOTE — Telephone Encounter (Signed)
Patient called request to know if you received her FMLA short disability form from her job by fax 03/30/17 by [employer]. I adv pt will send a message. Pt states they only give a certain amount of time to receive fax and have completed to be filled out.

## 2017-03-31 NOTE — Telephone Encounter (Signed)
I received [employer] short-term disability paperwork, this needs an appointment due to the extensive nature of the paperwork.

## 2017-04-04 ENCOUNTER — Ambulatory Visit (INDEPENDENT_AMBULATORY_CARE_PROVIDER_SITE_OTHER): Payer: Managed Care, Other (non HMO) | Admitting: Sports Medicine

## 2017-04-04 DIAGNOSIS — M797 Fibromyalgia: Secondary | ICD-10-CM | POA: Diagnosis not present

## 2017-04-04 NOTE — Assessment & Plan Note (Signed)
Short-term disability paperwork filled out. Return as needed.

## 2017-04-04 NOTE — Progress Notes (Signed)
  Subjective:    CC: Paperwork  HPI: Patient is here to fill out disability paperwork with me.  Past medical history:  Negative.  See flowsheet/record as well for more information.  Surgical history: Negative.  See flowsheet/record as well for more information.  Family history: Negative.  See flowsheet/record as well for more information.  Social history: Negative.  See flowsheet/record as well for more information.  Allergies, and medications have been entered into the medical record, reviewed, and no changes needed.   Review of Systems: No fevers, chills, night sweats, weight loss, chest pain, or shortness of breath.   Objective:    General: Well Developed, well nourished, and in no acute distress.  Neuro: Alert and oriented x3, extra-ocular muscles intact, sensation grossly intact.  HEENT: Normocephalic, atraumatic, pupils equal round reactive to light, neck supple, no masses, no lymphadenopathy, thyroid nonpalpable.  Skin: Warm and dry, no rashes. Cardiac: Regular rate and rhythm, no murmurs rubs or gallops, no lower extremity edema.  Respiratory: Clear to auscultation bilaterally. Not using accessory muscles, speaking in full sentences.  Impression and Recommendations:    Fibromyalgia Short-term disability paperwork filled out. Return as needed.  I spent 25 minutes with this patient, greater than 50% was face-to-face time counseling regarding the above diagnoses

## 2017-04-05 ENCOUNTER — Other Ambulatory Visit: Payer: Self-pay | Admitting: Sports Medicine

## 2017-04-14 ENCOUNTER — Ambulatory Visit: Payer: Managed Care, Other (non HMO)

## 2017-04-14 ENCOUNTER — Ambulatory Visit (INDEPENDENT_AMBULATORY_CARE_PROVIDER_SITE_OTHER): Payer: Managed Care, Other (non HMO) | Admitting: Sports Medicine

## 2017-04-14 ENCOUNTER — Encounter: Payer: Self-pay | Admitting: Sports Medicine

## 2017-04-14 VITALS — BP 128/89 | HR 96 | Wt 164.0 lb

## 2017-04-14 DIAGNOSIS — Z3042 Encounter for surveillance of injectable contraceptive: Secondary | ICD-10-CM

## 2017-04-14 DIAGNOSIS — M797 Fibromyalgia: Secondary | ICD-10-CM

## 2017-04-14 MED ORDER — MEDROXYPROGESTERONE ACETATE 150 MG/ML IM SUSP
150.0000 mg | Freq: Once | INTRAMUSCULAR | Status: AC
  Administered 2017-04-14: 150 mg via INTRAMUSCULAR

## 2017-04-14 MED ORDER — VENLAFAXINE HCL ER 75 MG PO CP24: 75.0000 mg | ORAL_CAPSULE | Freq: Every day | ORAL | 1 refills | Status: DC

## 2017-04-14 NOTE — Assessment & Plan Note (Signed)
Typically well controlled with 150 mg of Lyrica twice a day, he does get occasional flares, she is in one today. Declines any formal physical therapy, she is somewhat depressed with a flat affect, no suicidal or homicidal ideation but I am going to increase her Effexor to 225 mg for a couple of months.

## 2017-04-14 NOTE — Patient Instructions (Signed)
Continue Lyrica, I increased your Effexor to 225 mg, we will do this for 2 months. You will take a 150 mg tablet and a 75 mg tablet at the same time every day.

## 2017-04-14 NOTE — Progress Notes (Signed)
  Subjective:    CC: Follow-up  HPI: Fibromyalgia: Currently having a flare, this happens occasionally, she is well controlled for several months. Currently she is taking venlafaxine 150 and Lyrica 150 twice a day, she is having to leave her current job, this is treating significant financial stress, and depression. She has had increased myofascial-type pains since this happened as expected.  Past medical history:  Negative.  See flowsheet/record as well for more information.  Surgical history: Negative.  See flowsheet/record as well for more information.  Family history: Negative.  See flowsheet/record as well for more information.  Social history: Negative.  See flowsheet/record as well for more information.  Allergies, and medications have been entered into the medical record, reviewed, and no changes needed.   Review of Systems: No fevers, chills, night sweats, weight loss, chest pain, or shortness of breath.   Objective:    General: Well Developed, well nourished, and in no acute distress.  Neuro: Alert and oriented x3, extra-ocular muscles intact, sensation grossly intact.  HEENT: Normocephalic, atraumatic, pupils equal round reactive to light, neck supple, no masses, no lymphadenopathy, thyroid nonpalpable.  Skin: Warm and dry, no rashes. Cardiac: Regular rate and rhythm, no murmurs rubs or gallops, no lower extremity edema.  Respiratory: Clear to auscultation bilaterally. Not using accessory muscles, speaking in full sentences.  Impression and Recommendations:    Fibromyalgia Typically well controlled with 150 mg of Lyrica twice a day, he does get occasional flares, she is in one today. Declines any formal physical therapy, she is somewhat depressed with a flat affect, no suicidal or homicidal ideation but I am going to increase her Effexor to 225 mg for a couple of months.  I spent 25 minutes with this patient, greater than 50% was face-to-face time counseling regarding the  above diagnoses

## 2017-04-20 ENCOUNTER — Telehealth: Payer: Self-pay | Admitting: Sports Medicine

## 2017-04-20 ENCOUNTER — Other Ambulatory Visit: Payer: Self-pay | Admitting: Sports Medicine

## 2017-04-20 NOTE — Telephone Encounter (Signed)
Sure. I just saw her 6 days ago.

## 2017-04-20 NOTE — Telephone Encounter (Signed)
Patient called scheduled an appointment for 04/21/17 at 8:30am and she gave a restriction form to patricia to be completed but adv since she is coming in tomorrow can you complete it in her visit. Thanks

## 2017-04-21 ENCOUNTER — Ambulatory Visit (INDEPENDENT_AMBULATORY_CARE_PROVIDER_SITE_OTHER): Payer: Managed Care, Other (non HMO) | Admitting: Sports Medicine

## 2017-04-21 ENCOUNTER — Encounter: Payer: Self-pay | Admitting: Sports Medicine

## 2017-04-21 DIAGNOSIS — M797 Fibromyalgia: Secondary | ICD-10-CM

## 2017-04-21 NOTE — Progress Notes (Signed)
  Subjective:    CC: Follow-up  HPI: I just saw this 36 year old female less than a week ago for fibromyalgia. We increased her Effexor to 225 mg and she continues with Lyrica 150 twice a day. She is trying to get short-term disability, I explained to her that she was really not disabled, and that she should be working. She was denied her disability, and returns tearful. She has a completely normal cervical spine MRI, has been resistant to doing formal physical therapy in the past. Demands that we find a cure for fibromyalgia. When I recommended physical therapy, she told me that she works out at home all the time, which is not consistent with a disabled patient.  Past medical history:  Negative.  See flowsheet/record as well for more information.  Surgical history: Negative.  See flowsheet/record as well for more information.  Family history: Negative.  See flowsheet/record as well for more information.  Social history: Negative.  See flowsheet/record as well for more information.  Allergies, and medications have been entered into the medical record, reviewed, and no changes needed.   Review of Systems: No fevers, chills, night sweats, weight loss, chest pain, or shortness of breath.   Objective:    General: Well Developed, well nourished, and in no acute distress. Tearful in the exam room. Neuro: Alert and oriented x3, extra-ocular muscles intact, sensation grossly intact.  HEENT: Normocephalic, atraumatic, pupils equal round reactive to light, neck supple, no masses, no lymphadenopathy, thyroid nonpalpable.  Skin: Warm and dry, no rashes. Cardiac: Regular rate and rhythm, no murmurs rubs or gallops, no lower extremity edema.  Respiratory: Clear to auscultation bilaterally. Not using accessory muscles, speaking in full sentences. Neck: Negative spurling's Full neck range of motion Grip strength and sensation normal in bilateral hands Strength good C4 to T1 distribution No sensory change  to C4 to T1 Reflexes normal Diffusely tender all over.  Impression and Recommendations:    Fibromyalgia Persistent pain, patient is tearful, she did improve slightly with 150 mg of Lyrica twice a day and Effexor increased to 225 mg. Persistent complaint is neck pain, cervical spine MRI was completely normal. She was denied disability. I explained to her that she is not disabled, she can and should work, and that physical therapy, and physical activity are the best options to manage her fibromyalgia above and beyond the current medications that she is taking. When I recommended physical therapy, she told me that she works out at home all the time, which is not consistent with a disabled patient. She demanded that we find a cure for fibromyalgia and I explained that I really have nothing else to offer her. I will see her as needed.  I spent 25 minutes with this patient, greater than 50% was face-to-face time counseling regarding the above diagnoses

## 2017-04-21 NOTE — Assessment & Plan Note (Addendum)
Persistent pain, patient is tearful, she did improve slightly with 150 mg of Lyrica twice a day and Effexor increased to 225 mg. Persistent complaint is neck pain, cervical spine MRI was completely normal. She was denied disability. I explained to her that she is not disabled, she can and should work, and that physical therapy, and physical activity are the best options to manage her fibromyalgia above and beyond the current medications that she is taking. When I recommended physical therapy, she told me that she works out at home all the time, which is not consistent with a disabled patient. She demanded that we find a cure for fibromyalgia and I explained that I really have nothing else to offer her. I will see her as needed.

## 2017-05-02 ENCOUNTER — Ambulatory Visit (INDEPENDENT_AMBULATORY_CARE_PROVIDER_SITE_OTHER): Payer: Managed Care, Other (non HMO) | Admitting: Rehabilitative and Restorative Service Providers"

## 2017-05-02 DIAGNOSIS — R29898 Other symptoms and signs involving the musculoskeletal system: Secondary | ICD-10-CM | POA: Diagnosis not present

## 2017-05-02 DIAGNOSIS — M797 Fibromyalgia: Secondary | ICD-10-CM | POA: Diagnosis not present

## 2017-05-02 NOTE — Patient Instructions (Addendum)
Bent Leg Lift (Hook-Lying)    Tighten stomach and slowly raise right leg __10-12_ inches from floor. Keep trunk rigid. Hold __1-2__ seconds. Repeat _10___ times per set. Do ___1-2_ sets per session. Do __1__ sessions per day.   Combination (Hook-Lying)    Tighten stomach and slowly raise left leg and lower opposite arm over head. Keep trunk rigid. Repeat _10___ times per set. Do _1-2___ sets per session. Do _1___ sessions per day.    Piriformis Stretch   Lying on back, pull right knee toward opposite shoulder. Hold 30 seconds. Repeat 3 times. Do 2-3 sessions per day.   Axial Extension (Chin Tuck) lying down on back     Pull chin in and lengthen back of neck. Hold __10__ seconds while counting out loud. Repeat _5___ times. Do __2-3__ sessions per day.  Lower Trunk Rotation Stretch    Keeping back flat and feet together, rotate knees to left side. Hold __5 __ seconds. Repeat __10__ times per set. Do _1-2___ sets per session. Do ____ sessions per day.   TENS UNIT: This is helpful for muscle pain and spasm.   Search and Purchase a TENS 7000 2nd edition at www.tenspros.com. It should be less than $30.     TENS unit instructions: Do not shower or bathe with the unit on Turn the unit off before removing electrodes or batteries If the electrodes lose stickiness add a drop of water to the electrodes after they are disconnected from the unit and place on plastic sheet. If you continued to have difficulty, call the TENS unit company to purchase more electrodes. Do not apply lotion on the skin area prior to use. Make sure the skin is clean and dry as this will help prolong the life of the electrodes. After use, always check skin for unusual red areas, rash or other skin difficulties. If there are any skin problems, does not apply electrodes to the same area. Never remove the electrodes from the unit by pulling the wires. Do not use the TENS unit or electrodes other than as  directed. Do not change electrode placement without consultating your therapist or physician. Keep 2 fingers with between each electrode.

## 2017-05-02 NOTE — Therapy (Signed)
[facility] [place] [address] [place], [place], [address] Phone: [phone]   Fax:  [phone]  Physical Therapy Evaluation  Patient Details  Name: Brianna Mendoza MRN: [id number] Date of Birth: 12/21/1980 Referring Provider: Dr Benjamin Stainhekkekandam   Encounter Date: 05/02/2017      PT End of Session - 05/02/17 1103    Visit Number 1   Number of Visits 12   Date for PT Re-Evaluation 06/13/17   PT Start Time 1015   PT Stop Time 1115   PT Time Calculation (min) 60 min   Activity Tolerance Patient tolerated treatment well      Past Medical History:  Diagnosis Date  . Hypertension     Past Surgical History:  Procedure Laterality Date  . LTCS      There were no vitals filed for this visit.       Subjective Assessment - 05/02/17 1021    Subjective Patient reports that she has pain in neck; back; arms; legs which has been present for the past 2-3 years with flare up in symptoms 2 weeks ago but symptoms are now back to base line.    Pertinent History Fibromyalgia    How long can you sit comfortably? 15-30 min    How long can you stand comfortably? 15-30 min   How long can you walk comfortably? 15-30 min    Diagnostic tests xrays    Patient Stated Goals improve the pain    Currently in Pain? Yes   Pain Score 4    Pain Location Back   Pain Orientation Lower   Pain Descriptors / Indicators Nagging   Pain Type Chronic pain   Pain Onset More than a month ago   Pain Frequency Constant   Aggravating Factors  weather; activity; stress   Pain Relieving Factors hot bath; warm compress; relaxing    Multiple Pain Sites Yes   Pain Score 4   Pain Location Neck   Pain Orientation Right;Left   Pain Descriptors / Indicators Nagging;Aching;Dull   Pain Type Chronic pain   Pain Radiating Towards into the back of her head/neck   Pain Onset More than a month ago   Pain Frequency Constant   Aggravating Factors  weather; activity;  stress   Pain Relieving Factors warm compress; relaxation             [facility] - 05/02/17 0001      Assessment   Medical Diagnosis Fibromyalgia    Referring Provider Dr Benjamin Stainhekkekandam    Onset Date/Surgical Date 04/18/17  onset of symtpoms ~ 2-3 years ago    Hand Dominance Right   Next MD Visit RPN     Precautions   Precautions None     Balance Screen   Has the patient fallen in the past 6 months No   Has the patient had a decrease in activity level because of a fear of falling?  No   Is the patient reluctant to leave their home because of a fear of falling?  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  - sitting at computer or desk some moving about   1 1/2 yrs    Leisure daughter 37 yr old; 2 puppies; reading; some household chores      Sensation  Additional Comments WFL's per pt report      Posture/Postural Control   Posture Comments sits with rounded posture; standing with head forward posture; shoulders rounded and elevated; increased thoracic kyphosis     AROM   Cervical Flexion 49   Cervical Extension 47   Cervical - Right Side Bend 32   Cervical - Left Side Bend 36  discomfort   Cervical - Right Rotation 62  discomfort    Cervical - Left Rotation 65  discomfort    Lumbar Flexion 80%   Lumbar Extension 30%   Lumbar - Right Side Bend 70%   Lumbar - Left Side Bend 70%   Lumbar - Right Rotation 5%  discomfort/pain    Lumbar - Left Rotation 5%  discomfort/pain      Strength   Overall Strength Comments Patient does not offer full resistance for manual muscle testing - moves all limbs against gravity      Flexibility   Hamstrings tight ~ 75-80 deg   Quadriceps WFL's    ITB tight bilat    Piriformis tight bilat      Palpation   Spinal mobility  tender to spring testing cervical and lumbar spine    Palpation comment tenderness to palpation through cervical and lumbar musculature as well as into bilat hips; hamstrings; calves and shoulders anteriorly and posterior shhoulder girdle             Objective measurements completed on examination: See above findings.          [facility] Adult PT Treatment/Exercise - 05/02/17 0001      Self-Care   Self-Care --  ed re importance of regular structured ex w/ fibromyalgia     Neck Exercises: Seated   Neck Retraction 5 reps;10 secs     Lumbar Exercises: Stretches   Lower Trunk Rotation --  10 reps    Piriformis Stretch 3 reps;30 seconds     Lumbar Exercises: Supine   Bent Knee Raise 10 reps   Other Supine Lumbar Exercises alternate shoulder flexion elbows extended x 10      Moist Heat Therapy   Number Minutes Moist Heat 15 Minutes   Moist Heat Location Cervical;Lumbar Spine     Electrical Stimulation   Electrical Stimulation Location bilat cervical bilat lumbar    Electrical Stimulation Action pre mod   Electrical Stimulation Parameters to tolerance   Electrical Stimulation Goals Pain;Tone                PT Education - 05/02/17 1102    Education provided Yes   Education Details HEP; TENS unit    Person(s) Educated Patient   Methods Explanation;Demonstration;Tactile cues;Verbal cues;Handout   Comprehension Verbalized understanding;Returned demonstration;Verbal cues required;Tactile cues required             PT Long Term Goals - 05/02/17 1233      PT LONG TERM GOAL #1   Title Improve posture and alignment with patient to demonstate more upright posture 06/13/17   Time 6   Period Weeks   Status New   Target Date 06/13/17     PT LONG TERM GOAL #2   Title Establish active exercise program with focus on movement through proximal joints and spine ROM encouraging normal movement patterns 06/13/17   Time 6   Period Weeks   Status New   Target Date 06/13/17      PT LONG TERM GOAL #3   Title Patient to report increase in activity tolerance for home  and work to sit and stand for 30-60 min as needed 06/13/17   Time 6   Period Weeks   Status New   Target Date 06/13/17     PT LONG TERM GOAL #4   Title Patient to engage in aerobic activity for 15-20 min at least 3 times/week 06/13/17   Time 6   Period Weeks   Status New   Target Date 06/13/17     PT LONG TERM GOAL #5   Title Independent in HEP 06/13/17   Time 6   Period Weeks   Status New   Target Date 06/13/17                Plan - 05/02/17 1225    Clinical Impression Statement Brianna Mendoza presents with c/o pain in neck; back; elbow; hands; hips; knees bilaterally with symptoms present over the past 2-3 years. She experienced a recent flare up of symptoms last week which has improved since last week. Patient has limited cervical and lumbar mobility and ROM; decreased active ROM in bilat shoulders and hips. She is unable to provide resistance for manual muscle testing due to pain but moves all limbs against gravity. Patient has pain with light palpation/pressure throughout cervical and lumbar musculature as well as into the hips bilat. Patient reports sedentary lifestyle at work and home with her daughter doing the household chores for the most part. Educated patient re the importance of a consistent and structred exercise program. She will benefit form PT established exercise program to better manage pain.    Clinical Presentation Stable   Clinical Decision Making Low   Rehab Potential Good   PT Frequency 2x / week   PT Duration 6 weeks   PT Treatment/Interventions Patient/family education;ADLs/Self Care Home Management;Cryotherapy;Electrical Stimulation;Iontophoresis 4mg /ml Dexamethasone;Moist Heat;Ultrasound;Dry needling;Manual techniques;Therapeutic activities;Therapeutic exercise;Neuromuscular re-education   PT Next Visit Plan work on standing and sitting posture and alignment; add pec  stretch and supine snow angel; progress with core stabilization and LE stretching as tolerated. Add walking program.    Consulted and Agree with Plan of Care Patient      Patient will benefit from skilled therapeutic intervention in order to improve the following deficits and impairments:  Postural dysfunction, Improper body mechanics, Pain, Decreased range of motion, Decreased activity tolerance  Visit Diagnosis: Fibromyalgia - Plan: PT plan of care cert/re-cert  Other symptoms and signs involving the musculoskeletal system - Plan: PT plan of care cert/re-cert     Problem List Patient Active Problem List   Diagnosis Date Noted  . DDD (degenerative disc disease), cervical 07/29/2016  . Migraine headache 07/21/2016  . Sprain of ankle, left 04/19/2016  . Wheezing 05/12/2014  . Panic attack 02/20/2014  . Fibromyalgia 12/19/2013  . Abnormal MRI, knee 12/18/2013  . Essential hypertension, benign 08/13/2009    Aloysius Heinle Rober Minion PT, MPH  05/02/2017, 12:40 PM  [facility] [address] [place], [place], [address] Phone: [phone]   Fax:  [phone]  Name: Brianna Mendoza MRN: [id number] Date of Birth: 07-13-1981

## 2017-05-04 ENCOUNTER — Other Ambulatory Visit: Payer: Self-pay | Admitting: Sports Medicine

## 2017-05-07 ENCOUNTER — Other Ambulatory Visit: Payer: Self-pay | Admitting: Sports Medicine

## 2017-05-07 DIAGNOSIS — M7918 Myalgia, other site: Secondary | ICD-10-CM

## 2017-05-09 ENCOUNTER — Other Ambulatory Visit: Payer: Self-pay | Admitting: Sports Medicine

## 2017-05-09 DIAGNOSIS — M7918 Myalgia, other site: Secondary | ICD-10-CM

## 2017-05-10 ENCOUNTER — Telehealth: Payer: Self-pay | Admitting: Family Medicine

## 2017-05-10 NOTE — Telephone Encounter (Signed)
rx has been printed with Fax # and placed in Dr. Melvia Heaps's box

## 2017-05-10 NOTE — Telephone Encounter (Signed)
Patient would like to have Lyrica sent to [facility] in [place] once signed by Dr. Karie Schwalbe please. Thanks

## 2017-05-11 ENCOUNTER — Ambulatory Visit (INDEPENDENT_AMBULATORY_CARE_PROVIDER_SITE_OTHER): Payer: Managed Care, Other (non HMO) | Admitting: Physical Therapy

## 2017-05-11 DIAGNOSIS — M797 Fibromyalgia: Secondary | ICD-10-CM | POA: Diagnosis not present

## 2017-05-11 DIAGNOSIS — R29898 Other symptoms and signs involving the musculoskeletal system: Secondary | ICD-10-CM | POA: Diagnosis not present

## 2017-05-11 NOTE — Therapy (Addendum)
[place], [place], [address] Phone: [phone]   Fax:  [phone]  Physical Therapy Treatment  Patient Details  Name: Brianna Mendoza MRN: [id number] Date of Birth: 10-09-1981 Referring Provider: Dr. Dianah Field  Encounter Date: 05/11/2017      PT End of Session - 05/11/17 0850    Visit Number 2   Number of Visits 12   Date for PT Re-Evaluation 06/13/17   PT Start Time 0850   PT Stop Time 0947   PT Time Calculation (min) 57 min   Activity Tolerance Patient tolerated treatment well;No increased pain   Behavior During Therapy WFL for tasks assessed/performed      Past Medical History:  Diagnosis Date  . Hypertension     Past Surgical History:  Procedure Laterality Date  . LTCS      There were no vitals filed for this visit.      Subjective Assessment - 05/11/17 0855    Subjective Pt reports she received her exercises by mail yesterday.  She has been doing some from memory since last visit.  She feels it has helped.     Pertinent History Fibromyalgia    Currently in Pain? Yes   Pain Score 5    Pain Location Neck   Pain Orientation Left;Right   Pain Descriptors / Indicators Nagging   Aggravating Factors  stress, weather, certain activites   Pain Relieving Factors hot bath, relaxing             [facility] PT Assessment - 05/11/17 0001      Assessment   Medical Diagnosis Fibromyalgia    Referring Provider Dr. 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      Neck Exercises: Seated   Lateral Flexion Right;Left  AROM x 3 reps each side     Neck Exercises: Supine   Neck Retraction 5 reps;5 secs     Lumbar Exercises: Stretches   Passive Hamstring Stretch 2 reps;30 seconds  seated, straight back   Lower Trunk Rotation 5 reps;10 seconds   Lower Trunk Rotation Limitations trial of arms in T/Y, preferred arms in neutral; trial in sitting position x 15 sec each side.    Piriformis Stretch 30 seconds;3 reps  travell x 1, fig 4 x 1, seated x 1 reps     Lumbar Exercises: Seated   Other Seated Lumbar Exercises seated marching with core engaged x 5 reps; pt prefers supine version.    Other Seated Lumbar Exercises side lumbar stretch to tolerance x 2 reps each side.      Lumbar Exercises: Supine   Bent Knee Raise 10 reps  biofeedback air bladder used in lumbar region   Bent Knee Raise Limitations then opp arm/leg with core  engaged x 10      Shoulder Exercises: Stretch   Other Shoulder Stretches 3 position doorway stretch x 15-20 sec x 3 reps each position     Moist Heat Therapy   Number Minutes Moist Heat --  pt declined; feeling warm already     Electrical Stimulation   Electrical Stimulation Location bilat upper trap; Lt QL   Electrical Stimulation Action premod to each area   Electrical Stimulation Parameters to tolerance    Electrical Stimulation Goals Tone;Pain                PT Education - 05/11/17 0941    Education provided Yes   Education Details TENS education, HEP   Person(s) Educated Patient   Methods Explanation;Handout;Tactile cues;Verbal cues;Demonstration   Comprehension Verbalized understanding;Returned demonstration             PT Long Term Goals - 05/11/17 0949      PT LONG TERM GOAL #1   Title Improve posture  and alignment with patient to demonstate more upright posture 06/13/17   Time 6   Period Weeks   Status On-going     PT LONG TERM GOAL #2   Title Establish active exercise program with focus on movement through proximal joints and spine ROM encouraging normal movement patterns 06/13/17   Time 6   Period Weeks   Status On-going     PT LONG TERM GOAL #3   Title Patient to report increase in activity tolerance for home and work to sit and stand for 30-60 min as needed 06/13/17   Time 6   Period Weeks   Status On-going     PT LONG TERM GOAL #4   Title Patient to engage in aerobic activity for 15-20 min at least 3 times/week 06/13/17   Time 6   Period Weeks   Status On-going     PT LONG TERM GOAL #5   Title Independent in HEP 06/13/17   Time 6   Period Weeks   Status On-going               Plan - 05/11/17 0950    Clinical Impression Statement Pt required multiple cues to correct posture and form with exercises today.  She reported decrease in overall pain by 1-2 points with gentle exercises and elimination of pain by end of session with use of estim.  Pt progressing towards established goals.    Rehab Potential Good   PT Frequency 2x / week   PT Duration 6 weeks   PT Next Visit Plan Continue gentle stretching, postural strengthening.  Add walking program to HEP.    Consulted and Agree with Plan of Care Patient      Patient will benefit from skilled therapeutic intervention in order to improve the following deficits and impairments:  Postural dysfunction, Improper body mechanics, Pain, Decreased range of motion, Decreased activity tolerance  Visit Diagnosis: Fibromyalgia  Other symptoms and signs involving the musculoskeletal system     Problem List Patient Active Problem List   Diagnosis Date Noted  . DDD (degenerative disc disease), cervical 07/29/2016  . Migraine headache 07/21/2016  . Sprain of ankle, left 04/19/2016  . Wheezing 05/12/2014  . Panic attack  02/20/2014  . Fibromyalgia 12/19/2013  . Abnormal MRI, knee 12/18/2013  . Essential hypertension, benign 08/13/2009   Kerin Perna, PTA 05/11/17 10:04 AM   [place], [place], [address] Phone: [phone]   Fax:  [phone]  Name: Brianna Mendoza MRN: [id number] Date of Birth: April 24, 1981  PHYSICAL THERAPY DISCHARGE SUMMARY  Visits from Start of Care: 2  Current functional level related to goals / functional outcomes: See last progress note for discharge status   Remaining deficits: Unchanged    Education / Equipment: HEP  Plan: Patient agrees to discharge.  Patient goals were not met. Patient is being discharged due to not returning since the last visit.  ?????     Celyn P. Helene Kelp PT, MPH 08/16/17 2:25 PM

## 2017-05-11 NOTE — Patient Instructions (Signed)
Shoulder Blade Pinch    Pull arms back, pinching shoulder blades together. Hold __5-10__ seconds. Relax. (If necessary, steady self with arms back on support high enough so legs need not bend.) Repeat __5__ times. Do _2-3___ sessions per day.  Leg Extension (Hamstring)   Sit toward front edge of chair, with leg out straight, heel on floor, toes pointing toward body. Keeping back straight, bend forward at hip, breathing out through pursed lips. Return, breathing in. Repeat _2__ times. Repeat with other leg. Do _2__ sessions per day.  3 part core exercise    With neutral spine, tighten abdominal muscles sucking your belly button to back bone. Hold 10 seconds. Repeat _10_ times. Do _several__ times a day.  * Progress to do this activity sitting, standing, walking and functional activities.   * Self massage to back muscles with ball (against wall) * TENS 7000. (amazon.com)  TENS stands for Transcutaneous Electrical Nerve Stimulation. In other words, electrical impulses are allowed to pass through the skin in order to excite a nerve.   Purpose and Use of TENS:  TENS is a method used to manage acute and chronic pain without the use of drugs. It has been effective in managing pain associated with surgery, sprains, strains, trauma, rheumatoid arthritis, and neuralgias. It is a non-addictive, low risk, and non-invasive technique used to control pain. It is not, by any means, a curative form of treatment.   How TENS Works:  Most TENS units are a Statisticiansmall pocket-sized unit powered by one 9 volt battery. Attached to the outside of the unit are two lead wires where two pins and/or snaps connect on each wire. All units come with a set of four reusable pads or electrodes. These are placed on the skin surrounding the area involved. By inserting the leads into  the pads, the electricity can pass from the unit making the circuit complete.  As the intensity is turned up slowly, the electrical current enters  the body from the electrodes through the skin to the surrounding nerve fibers. This triggers the release of hormones from within the body. These hormones contain pain relievers. By increasing the circulation of these hormones, the person's pain may be lessened. It is also believed that the electrical stimulation itself helps to block the pain messages being sent to the brain, thus also decreasing the body's perception of pain.   Hazards:  TENS units are NOT to be used by patients with PACEMAKERS, DEFIBRILLATORS, DIABETIC PUMPS, PREGNANT WOMEN, and patients with SEIZURE DISORDERS.  TENS units are NOT to be used over the heart, throat, brain, or spinal cord.  One of the major side effects from the TENS unit may be skin irritation. Some people may develop a rash if they are sensitive to the materials used in the electrodes or the connecting wires.   Avoid overuse due the body getting used to the stem making it not as effective over time.

## 2017-05-12 ENCOUNTER — Other Ambulatory Visit: Payer: Self-pay | Admitting: Sports Medicine

## 2017-05-17 ENCOUNTER — Telehealth: Payer: Self-pay

## 2017-05-17 ENCOUNTER — Ambulatory Visit: Payer: Managed Care, Other (non HMO) | Admitting: Family Medicine

## 2017-05-17 DIAGNOSIS — R062 Wheezing: Secondary | ICD-10-CM

## 2017-05-17 MED ORDER — ALBUTEROL SULFATE HFA 108 (90 BASE) MCG/ACT IN AERS: 2.0000 | INHALATION_SPRAY | Freq: Four times a day (QID) | RESPIRATORY_TRACT | 2 refills | Status: DC | PRN

## 2017-05-17 NOTE — Progress Notes (Deleted)
Subjective:    CC: Medications   HPI:  F/U Fibromyalgia. She's been seeing sports medicine and they recommended physical therapy. She is Artie been to a couple sessions thus far. She is also currently on Lyrica.  Hypertension- Pt denies chest pain, SOB, dizziness, or heart palpitations.  Taking meds as directed w/o problems.  Denies medication side effects.    Migraines HA -   Past medical history, Surgical history, Family history not pertinant except as noted below, Social history, Allergies, and medications have been entered into the medical record, reviewed, and corrections made.   Review of Systems: No fevers, chills, night sweats, weight loss, chest pain, or shortness of breath.   Objective:    General: Well Developed, well nourished, and in no acute distress.  Neuro: Alert and oriented x3, extra-ocular muscles intact, sensation grossly intact.  HEENT: Normocephalic, atraumatic  Skin: Warm and dry, no rashes. Cardiac: Regular rate and rhythm, no murmurs rubs or gallops, no lower extremity edema.  Respiratory: Clear to auscultation bilaterally. Not using accessory muscles, speaking in full sentences.   Impression and Recommendations:    Fibromyalgia-  hypertension-  Migraine headaches-

## 2017-05-17 NOTE — Addendum Note (Signed)
Addended by: Nani GasserMETHENEY, Amarys Sliwinski D on: 05/17/2017 10:11 AM   Modules accepted: Orders

## 2017-05-17 NOTE — Telephone Encounter (Signed)
PT needs refill on Inhaler to [facility] pharmacy on file

## 2017-05-22 ENCOUNTER — Encounter: Payer: Managed Care, Other (non HMO) | Admitting: Physical Therapy

## 2017-05-22 ENCOUNTER — Encounter: Payer: Self-pay | Admitting: Physical Therapy

## 2017-05-22 ENCOUNTER — Telehealth: Payer: Self-pay | Admitting: Physical Therapy

## 2017-05-22 NOTE — Telephone Encounter (Signed)
Patient called to inform therapist that she hasn't had any pain since last session on 8/2.  She was wondering if she should come to today's session, or if would be alright to hold/cancel.  She has bought and received a TENS unit that she can use if pain returns (with assistance from daughter).   Informed patient it was ok to hold therapy today if she desired.  She was informed that exercises would be progressed at the next session to ensure she is building strength without flaring symptoms up.  She verbalized understanding and requested to cancel today's appointment.  Patient requested to hold therapy until 05/25/17, which by she will either d/c (per patient request) if she is doing well or schedule a follow up appointment.    Brianna CamelJennifer Mendoza, PTA 05/22/17 3:22 PM

## 2017-06-02 ENCOUNTER — Other Ambulatory Visit: Payer: Self-pay | Admitting: Sports Medicine

## 2017-06-02 DIAGNOSIS — M797 Fibromyalgia: Secondary | ICD-10-CM

## 2017-06-05 ENCOUNTER — Other Ambulatory Visit: Payer: Self-pay | Admitting: Sports Medicine

## 2017-06-05 DIAGNOSIS — M7918 Myalgia, other site: Secondary | ICD-10-CM

## 2017-06-22 ENCOUNTER — Other Ambulatory Visit: Payer: Self-pay | Admitting: Sports Medicine

## 2017-06-26 ENCOUNTER — Other Ambulatory Visit: Payer: Self-pay | Admitting: Sports Medicine

## 2017-06-26 ENCOUNTER — Telehealth: Payer: Self-pay | Admitting: Family Medicine

## 2017-06-26 NOTE — Telephone Encounter (Signed)
Pt called.  She  Is requesting refill on Tramadol.  Thank you.

## 2017-06-26 NOTE — Telephone Encounter (Signed)
Dr. Benjamin Stain refills this medication for her. Will fwd to him for refill.Brianna Mendoza'

## 2017-07-11 ENCOUNTER — Ambulatory Visit (INDEPENDENT_AMBULATORY_CARE_PROVIDER_SITE_OTHER): Payer: Managed Care, Other (non HMO) | Admitting: Family Medicine

## 2017-07-11 VITALS — BP 129/83 | HR 106

## 2017-07-11 DIAGNOSIS — Z3042 Encounter for surveillance of injectable contraceptive: Secondary | ICD-10-CM | POA: Diagnosis not present

## 2017-07-11 MED ORDER — MEDROXYPROGESTERONE ACETATE 150 MG/ML IM SUSP
150.0000 mg | Freq: Once | INTRAMUSCULAR | Status: AC
  Administered 2017-07-11: 150 mg via INTRAMUSCULAR

## 2017-07-11 NOTE — Progress Notes (Signed)
Agree with above.  Laaibah Wartman, MD  

## 2017-07-11 NOTE — Progress Notes (Signed)
Pt came into clinic today for her depo shot. Pt is within appropriate window for administration. Pt tolerated injection in right deltoid well, no immediate complications. 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 She will experience a lag in Rx refills during this time period. Asked for Tramadol samples, advised we do not carry those. She can look at [email or web address] to see if that would be helpful. Printed copy of next vaccine window given, Pt will call to schedule. No further questions.

## 2017-09-27 ENCOUNTER — Ambulatory Visit (INDEPENDENT_AMBULATORY_CARE_PROVIDER_SITE_OTHER): Payer: Medicaid Other | Admitting: Family Medicine

## 2017-09-27 VITALS — BP 107/88 | HR 102 | Temp 98.8°F

## 2017-09-27 DIAGNOSIS — Z3042 Encounter for surveillance of injectable contraceptive: Secondary | ICD-10-CM

## 2017-09-27 DIAGNOSIS — M797 Fibromyalgia: Secondary | ICD-10-CM

## 2017-09-27 MED ORDER — MEDROXYPROGESTERONE ACETATE 150 MG/ML IM SUSP
150.0000 mg | Freq: Once | INTRAMUSCULAR | Status: AC
  Administered 2017-09-27: 150 mg via INTRAMUSCULAR

## 2017-09-27 MED ORDER — IBUPROFEN 800 MG PO TABS: 800.0000 mg | ORAL_TABLET | Freq: Three times a day (TID) | ORAL | 2 refills | Status: DC | PRN

## 2017-09-27 NOTE — Progress Notes (Signed)
Agree with documentation as above.   Trayvon Trumbull, MD  

## 2017-09-27 NOTE — Progress Notes (Signed)
   Subjective:    Patient ID: Brianna Mendoza, female    DOB: [birth year], 36 y.o.   MRN: [id number]  HPI  Brianna Mendoza is here for Depo Provera injection.   Fibromyalgia - She has stopped taking Lyrica, Effexor and Tramadol. She is trying to eat better and take care of her body. She is now just taking Ibuprofen as needed for flare ups.   Review of Systems     Objective:   Physical Exam        Assessment & Plan:  Contraceptive Management - Patient tolerated injection well without complications. Patient advised to schedule next injection 11-14 weeks from today.   Fibromyalgia - Ibuprofen 800 mg refilled.

## 2017-11-03 ENCOUNTER — Other Ambulatory Visit: Payer: Self-pay | Admitting: Family Medicine

## 2017-12-03 ENCOUNTER — Other Ambulatory Visit: Payer: Self-pay | Admitting: Family Medicine

## 2017-12-11 ENCOUNTER — Ambulatory Visit (INDEPENDENT_AMBULATORY_CARE_PROVIDER_SITE_OTHER): Payer: Medicaid Other | Admitting: Family Medicine

## 2017-12-11 VITALS — BP 110/66 | HR 79 | Temp 98.5°F

## 2017-12-11 DIAGNOSIS — Z3042 Encounter for surveillance of injectable contraceptive: Secondary | ICD-10-CM

## 2017-12-11 DIAGNOSIS — Z111 Encounter for screening for respiratory tuberculosis: Secondary | ICD-10-CM

## 2017-12-11 DIAGNOSIS — Z23 Encounter for immunization: Secondary | ICD-10-CM

## 2017-12-11 MED ORDER — MEDROXYPROGESTERONE ACETATE 150 MG/ML IM SUSP
150.0000 mg | Freq: Once | INTRAMUSCULAR | Status: AC
  Administered 2017-12-11: 150 mg via INTRAMUSCULAR

## 2017-12-11 NOTE — Progress Notes (Signed)
Agree with documentation as above.   Jerard Bays, MD  

## 2017-12-11 NOTE — Progress Notes (Signed)
Pt came into clinic today for her depo shot and PPD placement. Pt is within appropriate window for administration. Pt tolerated injection in right deltoid (per request) well, no immediate complications. 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 Pt has had PPD test in the past, never tested positive. Pt tolerated PPD placement on left forearm well, no immediate complications. Advised to return on Wednesday for PPD read, and we will print a copy of the test for her then to take to her new job. No further questions.

## 2017-12-13 ENCOUNTER — Ambulatory Visit (INDEPENDENT_AMBULATORY_CARE_PROVIDER_SITE_OTHER): Payer: Medicaid Other | Admitting: Family Medicine

## 2017-12-13 VITALS — BP 116/74 | HR 91 | Temp 98.5°F | Resp 18 | Wt 155.0 lb

## 2017-12-13 DIAGNOSIS — Z111 Encounter for screening for respiratory tuberculosis: Secondary | ICD-10-CM | POA: Diagnosis not present

## 2017-12-13 DIAGNOSIS — Z23 Encounter for immunization: Secondary | ICD-10-CM | POA: Diagnosis not present

## 2017-12-13 LAB — TB SKIN TEST
INDURATION: 0 mm
TB SKIN TEST: NEGATIVE

## 2017-12-13 NOTE — Progress Notes (Signed)
HPI: Patient is here for a PPD read.  Assessment and Plan: Results were Negative - 0 mm.

## 2017-12-14 NOTE — Progress Notes (Signed)
Agree with documentation as above.   Catherine Metheney, MD  

## 2018-01-02 ENCOUNTER — Other Ambulatory Visit: Payer: Self-pay | Admitting: Family Medicine

## 2018-01-03 ENCOUNTER — Telehealth: Payer: Self-pay | Admitting: Family Medicine

## 2018-01-03 MED ORDER — HYDROCHLOROTHIAZIDE 12.5 MG PO CAPS: 12.5000 mg | ORAL_CAPSULE | Freq: Every day | ORAL | 0 refills | Status: DC

## 2018-01-03 NOTE — Telephone Encounter (Signed)
2 week supply sent. Left VM for Pt.

## 2018-01-03 NOTE — Telephone Encounter (Signed)
Pt called. She wants to get 2 wks refill on her HCTZ.  She has scheduled an appointment for April 11th.

## 2018-01-18 ENCOUNTER — Ambulatory Visit (INDEPENDENT_AMBULATORY_CARE_PROVIDER_SITE_OTHER): Payer: Medicaid Other | Admitting: Family Medicine

## 2018-01-18 ENCOUNTER — Encounter: Payer: Self-pay | Admitting: Family Medicine

## 2018-01-18 VITALS — BP 122/72 | HR 96 | Ht 64.0 in | Wt 150.0 lb

## 2018-01-18 DIAGNOSIS — R790 Abnormal level of blood mineral: Secondary | ICD-10-CM | POA: Diagnosis not present

## 2018-01-18 DIAGNOSIS — Z0000 Encounter for general adult medical examination without abnormal findings: Secondary | ICD-10-CM

## 2018-01-18 DIAGNOSIS — M797 Fibromyalgia: Secondary | ICD-10-CM | POA: Diagnosis not present

## 2018-01-18 DIAGNOSIS — G43009 Migraine without aura, not intractable, without status migrainosus: Secondary | ICD-10-CM | POA: Diagnosis not present

## 2018-01-18 DIAGNOSIS — I10 Essential (primary) hypertension: Secondary | ICD-10-CM | POA: Diagnosis not present

## 2018-01-18 MED ORDER — HYDROCHLOROTHIAZIDE 12.5 MG PO CAPS: 12.5000 mg | ORAL_CAPSULE | Freq: Every day | ORAL | 1 refills | Status: DC

## 2018-01-18 NOTE — Progress Notes (Signed)
Subjective:    CC: HTN  HPI:  Hypertension- Pt denies chest pain, SOB, dizziness, or heart palpitations.  Taking meds as directed w/o problems.  Denies medication side effects.    Fibromyalgia-she is actually doing really well.  She actually came off of her Lyrica and tramadol about 6 months ago and has really been working on lifestyle changes.  She is eating more healthy predominantly organic foods.  She is worked hard to lose some weight.  She is been exercising regularly and has been using essential oils to help with pain and flares.  Low magnesium-she recently started a supplement.  Migraine headaches-overall doing well.  She says she really is just gets an occasional tension headache now.  She will use ibuprofen occasionally and had a piercing done in her left ear which has really helped.  She had it done about a year ago.  Past medical history, Surgical history, Family history not pertinant except as noted below, Social history, Allergies, and medications have been entered into the medical record, reviewed, and corrections made.   Review of Systems: No fevers, chills, night sweats, weight loss, chest pain, or shortness of breath.   Objective:    General: Well Developed, well nourished, and in no acute distress.  Neuro: Alert and oriented x3, extra-ocular muscles intact, sensation grossly intact.  HEENT: Normocephalic, atraumatic  Skin: Warm and dry, no rashes. Cardiac: Regular rate and rhythm, no murmurs rubs or gallops, no lower extremity edema.  Respiratory: Clear to auscultation bilaterally. Not using accessory muscles, speaking in full sentences.   Impression and Recommendations:    HTN - Well controlled.  BMI is down to 25 which is fantastic.  Continue current regimen. Follow up in  6 months.    Fibromyalgia-she is doing really well.  Migraine headaches-under good control.  Using ibuprofen as needed.  No prophylaxis at this time.  To recheck magnesium level.

## 2018-01-23 LAB — COMPLETE METABOLIC PANEL WITH GFR
AG Ratio: 1.7 (calc) (ref 1.0–2.5)
ALT: 9 U/L (ref 6–29)
AST: 12 U/L (ref 10–30)
Albumin: 4 g/dL (ref 3.6–5.1)
Alkaline phosphatase (APISO): 76 U/L (ref 33–115)
BUN: 10 mg/dL (ref 7–25)
CALCIUM: 9.4 mg/dL (ref 8.6–10.2)
CO2: 28 mmol/L (ref 20–32)
Chloride: 106 mmol/L (ref 98–110)
Creat: 1.06 mg/dL (ref 0.50–1.10)
GFR, EST AFRICAN AMERICAN: 78 mL/min/{1.73_m2} (ref >=60)
GFR, EST NON AFRICAN AMERICAN: 67 mL/min/{1.73_m2} (ref >=60)
GLUCOSE: 100 mg/dL — AB (ref 65–99)
Globulin: 2.4 g/dL (calc) (ref 1.9–3.7)
Potassium: 3.6 mmol/L (ref 3.5–5.3)
Sodium: 141 mmol/L (ref 135–146)
TOTAL PROTEIN: 6.4 g/dL (ref 6.1–8.1)
Total Bilirubin: 0.4 mg/dL (ref 0.2–1.2)

## 2018-01-23 LAB — LIPID PANEL
CHOL/HDL RATIO: 3.3 (calc) (ref <5.0)
Cholesterol: 147 mg/dL (ref <200)
HDL: 45 mg/dL — ABNORMAL LOW (ref >50)
LDL CHOLESTEROL (CALC): 85 mg/dL
NON-HDL CHOLESTEROL (CALC): 102 mg/dL (ref <130)
TRIGLYCERIDES: 82 mg/dL (ref <150)

## 2018-01-23 LAB — TSH: TSH: 1.46 mIU/L

## 2018-01-23 LAB — MAGNESIUM: MAGNESIUM: 2.1 mg/dL (ref 1.5–2.5)

## 2018-01-29 ENCOUNTER — Other Ambulatory Visit (HOSPITAL_COMMUNITY)
Admission: RE | Admit: 2018-01-29 | Discharge: 2018-01-29 | Disposition: A | Discharge status: Home / Self Care | Payer: Medicaid Other | Source: Ambulatory Visit | Attending: Family Medicine | Admitting: Family Medicine

## 2018-01-29 ENCOUNTER — Ambulatory Visit (INDEPENDENT_AMBULATORY_CARE_PROVIDER_SITE_OTHER): Payer: Medicaid Other | Admitting: Family Medicine

## 2018-01-29 VITALS — BP 113/71 | HR 104 | Ht 64.0 in | Wt 151.0 lb

## 2018-01-29 DIAGNOSIS — Z1151 Encounter for screening for human papillomavirus (HPV): Secondary | ICD-10-CM | POA: Diagnosis not present

## 2018-01-29 DIAGNOSIS — R7309 Other abnormal glucose: Secondary | ICD-10-CM | POA: Diagnosis not present

## 2018-01-29 DIAGNOSIS — Z0000 Encounter for general adult medical examination without abnormal findings: Secondary | ICD-10-CM | POA: Diagnosis not present

## 2018-01-29 DIAGNOSIS — Z124 Encounter for screening for malignant neoplasm of cervix: Secondary | ICD-10-CM | POA: Insufficient documentation

## 2018-01-29 DIAGNOSIS — H65193 Other acute nonsuppurative otitis media, bilateral: Secondary | ICD-10-CM

## 2018-01-29 LAB — POCT GLYCOSYLATED HEMOGLOBIN (HGB A1C): HEMOGLOBIN A1C: 5.2

## 2018-01-29 MED ORDER — PREDNISONE 20 MG PO TABS: 40.0000 mg | ORAL_TABLET | Freq: Every day | ORAL | 0 refills | Status: DC

## 2018-01-29 MED ORDER — FLUTICASONE PROPIONATE 50 MCG/ACT NA SUSP: 2.0000 | Freq: Every day | NASAL | 6 refills | Status: AC

## 2018-01-29 NOTE — Progress Notes (Signed)
Subjective:     Brianna Mendoza is a 37 y.o. female and is here for a comprehensive physical exam. The patient reports problems - Is having pressure in her ears again particularly on the left side.  She feels like there is fluid buildup again.  This seems to happen every year.  Is been doing her Saizen saline rinse.  She is not currently on a nasal steroid spray.  Social History   Socioeconomic History  . Marital status: Single    Spouse name: Not on file  . Number of children: Not on file  . Years of education: Not on file  . Highest education level: Not on file  Occupational History  . Not on file  Social Needs  . Financial resource strain: Not on file  . Food insecurity:    Worry: Not on file    Inability: Not on file  . Transportation needs:    Medical: Not on file    Non-medical: Not on file  Tobacco Use  . Smoking status: Current Every Day Smoker    Packs/day: 0.25    Years: 4.00    Pack years: 1.00    Types: Cigarettes  . Smokeless tobacco: Never Used  Substance and Sexual Activity  . Alcohol use: No    Alcohol/week: 0.0 oz  . Drug use: No  . 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 Physical activity:    Days per week: Not on file    Minutes per session: Not on file  . Stress: Not on file  Relationships  . Social connections:    Talks on phone: Not on file    Gets together: Not on file    Attends religious service: Not on file    Active member of club or organization: Not on file    Attends meetings of clubs or organizations: Not on file    Relationship status: Not on file  . Intimate partner violence:    Fear of current or ex partner: Not on file    Emotionally abused: Not on file    Physically abused: Not on file    Forced sexual activity: Not on file  Other Topics Concern  . 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 PAP SMEAR  09/22/2016  . INFLUENZA VACCINE  06/10/2018 (Originally 05/10/2018)  . TETANUS/TDAP  02/07/2025  . HIV Screening  Completed    The following portions of the patient's history were reviewed and updated as appropriate: allergies, current medications, past family history, past medical history, past social history, past surgical history and problem list.  Review of Systems A comprehensive review of systems was negative.   Objective:    BP 113/71   Pulse (!) 104   Ht 5\' 4"  (1.626 m)   Wt 151 lb (68.5 kg)   SpO2 100%   BMI 25.92 kg/m  General appearance: alert, cooperative and appears stated age Head: Normocephalic, without obvious abnormality, atraumatic Eyes: conj clear, EOMI, PEERLA Ears: Effusion on both ears.  No significant erythema. Nose: Nares normal. Septum midline. Mucosa normal. No drainage or sinus tenderness. Throat: lips, mucosa, and tongue normal; teeth and gums normal Neck: no adenopathy, no carotid bruit, no JVD, supple, symmetrical, trachea  midline and thyroid not enlarged, symmetric, no tenderness/mass/nodules Back: symmetric, no curvature. ROM normal. No CVA tenderness. Lungs: clear to auscultation bilaterally Breasts: normal appearance, no masses or tenderness Heart: regular rate and rhythm, S1, S2 normal, no murmur, click, rub or gallop Abdomen: soft, non-tender; bowel sounds normal; no masses,  no organomegaly Pelvic: cervix normal in appearance, external genitalia normal, no adnexal masses or tenderness, no cervical motion tenderness, rectovaginal septum normal, uterus normal size, shape, and consistency and vagina normal without discharge Extremities: extremities normal, atraumatic, no cyanosis or edema Pulses: 2+ and symmetric Skin: Skin color, texture, turgor normal. No rashes or lesions Lymph nodes: Cervical,  supraclavicular, and axillary nodes normal. Neurologic: Alert and oriented X 3, normal strength and tone. Normal symmetric reflexes. Normal coordination and gait    Assessment:    Healthy female exam.      Plan:     See After Visit Summary for Counseling Recommendations   Keep up a regular exercise program and make sure you are eating a healthy diet Try to eat 4 servings of dairy a day, or if you are lactose intolerant take a calcium with vitamin D daily.  Your vaccines are up to date.   Bilateral ear effusions-we will treat with oral prednisone and have her start Flonase or Nasonex.  If not improving then please let me know.  Abnormal  Glucose - Check A1C.  A1C looks fantastic at 5.2.  No sign of prediabetes or diabetes.

## 2018-01-29 NOTE — Patient Instructions (Signed)
Preventive Care 18-39 Years, Female Preventive care refers to lifestyle choices and visits with your health care provider that can promote health and wellness. What does preventive care include?  A yearly physical exam. This is also called an annual well check.  Dental exams once or twice a year.  Routine eye exams. Ask your health care provider how often you should have your eyes checked.  Personal lifestyle choices, including: ? Daily care of your teeth and gums. ? Regular physical activity. ? Eating a healthy diet. ? Avoiding tobacco and drug use. ? Limiting alcohol use. ? Practicing safe sex. ? Taking vitamin and mineral supplements as recommended by your health care provider. What happens during an annual well check? The services and screenings done by your health care provider during your annual well check will depend on your age, overall health, lifestyle risk factors, and family history of disease. Counseling Your health care provider may ask you questions about your:  Alcohol use.  Tobacco use.  Drug use.  Emotional well-being.  Home and relationship well-being.  Sexual activity.  Eating habits.  Work and work Statistician.  Method of birth control.  Menstrual cycle.  Pregnancy history.  Screening You may have the following tests or measurements:  Height, weight, and BMI.  Diabetes screening. This is done by checking your blood sugar (glucose) after you have not eaten for a while (fasting).  Blood pressure.  Lipid and cholesterol levels. These may be checked every 5 years starting at age 38.  Skin check.  Hepatitis C blood test.  Hepatitis B blood test.  Sexually transmitted disease (STD) testing.  BRCA-related cancer screening. This may be done if you have a family history of breast, ovarian, tubal, or peritoneal cancers.  Pelvic exam and Pap test. This may be done every 3 years starting at age 38. Starting at age 30, this may be done  every 5 years if you have a Pap test in combination with an HPV test.  Discuss your test results, treatment options, and if necessary, the need for more tests with your health care provider. Vaccines Your health care provider may recommend certain vaccines, such as:  Influenza vaccine. This is recommended every year.  Tetanus, diphtheria, and acellular pertussis (Tdap, Td) vaccine. You may need a Td booster every 10 years.  Varicella vaccine. You may need this if you have not been vaccinated.  HPV vaccine. If you are 39 or younger, you may need three doses over 6 months.  Measles, mumps, and rubella (MMR) vaccine. You may need at least one dose of MMR. You may also need a second dose.  Pneumococcal 13-valent conjugate (PCV13) vaccine. You may need this if you have certain conditions and were not previously vaccinated.  Pneumococcal polysaccharide (PPSV23) vaccine. You may need one or two doses if you smoke cigarettes or if you have certain conditions.  Meningococcal vaccine. One dose is recommended if you are age 68-21 years and a first-year college student living in a residence hall, or if you have one of several medical conditions. You may also need additional booster doses.  Hepatitis A vaccine. You may need this if you have certain conditions or if you travel or work in places where you may be exposed to hepatitis A.  Hepatitis B vaccine. You may need this if you have certain conditions or if you travel or work in places where you may be exposed to hepatitis B.  Haemophilus influenzae type b (Hib) vaccine. You may need this  if you have certain risk factors.  Talk to your health care provider about which screenings and vaccines you need and how often you need them. This information is not intended to replace advice given to you by your health care provider. Make sure you discuss any questions you have with your health care provider. Document Released: 11/22/2001 Document Revised:  06/15/2016 Document Reviewed: 07/28/2015 Elsevier Interactive Patient Education  2018 Elsevier Inc.  

## 2018-02-01 LAB — CYTOLOGY - PAP
Diagnosis: NEGATIVE
HPV (WINDOPATH): NOT DETECTED

## 2018-02-01 NOTE — Progress Notes (Signed)
Call patient: Your Pap smear is normal. Repeat in 5 years.

## 2018-02-02 ENCOUNTER — Other Ambulatory Visit: Payer: Self-pay | Admitting: Family Medicine

## 2018-02-26 ENCOUNTER — Ambulatory Visit: Payer: Medicaid Other

## 2018-02-27 ENCOUNTER — Ambulatory Visit (INDEPENDENT_AMBULATORY_CARE_PROVIDER_SITE_OTHER): Payer: Medicaid Other | Admitting: Family Medicine

## 2018-02-27 VITALS — BP 111/82 | HR 88

## 2018-02-27 DIAGNOSIS — Z3042 Encounter for surveillance of injectable contraceptive: Secondary | ICD-10-CM | POA: Diagnosis not present

## 2018-02-27 MED ORDER — MEDROXYPROGESTERONE ACETATE 150 MG/ML IM SUSP
150.0000 mg | Freq: Once | INTRAMUSCULAR | Status: AC
  Administered 2018-02-27: 150 mg via INTRAMUSCULAR

## 2018-02-27 NOTE — Progress Notes (Signed)
Agree with documentation as above.   Kyna Blahnik, MD  

## 2018-02-27 NOTE — Progress Notes (Signed)
Pt came into clinic today for her depo shot and PPD placement. Pt is within appropriate window for administration. Pt tolerated injection in left deltoid (per request) well, no immediate complications. Printed copy of next injection window provided and appointment schedule prior to leaving clinic. Pt also wanted PCP to know she "tried to come off her BP Rx over the weekend, but couldn't." Pt states it caused an increase in her BP so she is back on it. BP in office today at goal. No further questions or concerns.

## 2018-04-03 ENCOUNTER — Other Ambulatory Visit: Payer: Self-pay | Admitting: Family Medicine

## 2018-04-22 ENCOUNTER — Other Ambulatory Visit: Payer: Self-pay | Admitting: Family Medicine

## 2018-04-22 DIAGNOSIS — R062 Wheezing: Secondary | ICD-10-CM

## 2018-05-21 ENCOUNTER — Ambulatory Visit (INDEPENDENT_AMBULATORY_CARE_PROVIDER_SITE_OTHER): Payer: Medicaid Other | Admitting: Family Medicine

## 2018-05-21 VITALS — BP 131/87 | HR 88 | Wt 154.0 lb

## 2018-05-21 DIAGNOSIS — Z3042 Encounter for surveillance of injectable contraceptive: Secondary | ICD-10-CM

## 2018-05-21 MED ORDER — MEDROXYPROGESTERONE ACETATE 150 MG/ML IM SUSP
150.0000 mg | Freq: Once | INTRAMUSCULAR | Status: AC
  Administered 2018-05-21: 150 mg via INTRAMUSCULAR

## 2018-05-21 NOTE — Progress Notes (Signed)
Catherine Metheney, MD  

## 2018-05-21 NOTE — Progress Notes (Signed)
   Subjective:    Patient ID: Brianna Mendoza, female    DOB: Feb 14, 1981, 37 y.o.   MRN: [id number]  HPI  Brianna Mendoza is here for a Depo Provera injection. Denies chest pain, shortness of breath, headaches, mood changes or problems with medication. It has been < 14 weeks since her last Depo Provera injection.  Review of Systems     Objective:   Physical Exam        Assessment & Plan:  Contraception - Patient tolerated right deltoid injection well without complications. Patient advised to schedule next injection between 10/28-11/11.

## 2018-07-18 ENCOUNTER — Other Ambulatory Visit: Payer: Self-pay

## 2018-07-18 DIAGNOSIS — I10 Essential (primary) hypertension: Secondary | ICD-10-CM

## 2018-07-18 MED ORDER — HYDROCHLOROTHIAZIDE 12.5 MG PO CAPS: 12.5000 mg | ORAL_CAPSULE | Freq: Every day | ORAL | 0 refills | Status: DC

## 2018-08-06 ENCOUNTER — Encounter: Payer: Self-pay | Admitting: Family Medicine

## 2018-08-06 ENCOUNTER — Ambulatory Visit (INDEPENDENT_AMBULATORY_CARE_PROVIDER_SITE_OTHER): Payer: Medicaid Other | Admitting: Family Medicine

## 2018-08-06 VITALS — BP 110/78 | HR 87 | Ht 64.0 in | Wt 153.0 lb

## 2018-08-06 DIAGNOSIS — M797 Fibromyalgia: Secondary | ICD-10-CM

## 2018-08-06 DIAGNOSIS — I10 Essential (primary) hypertension: Secondary | ICD-10-CM | POA: Diagnosis not present

## 2018-08-06 DIAGNOSIS — F5102 Adjustment insomnia: Secondary | ICD-10-CM | POA: Diagnosis not present

## 2018-08-06 DIAGNOSIS — Z3042 Encounter for surveillance of injectable contraceptive: Secondary | ICD-10-CM

## 2018-08-06 MED ORDER — SUVOREXANT 10 MG PO TABS: 1.0000 | ORAL_TABLET | Freq: Every day | ORAL | 0 refills | Status: DC

## 2018-08-06 MED ORDER — MEDROXYPROGESTERONE ACETATE 150 MG/ML IM SUSP
150.0000 mg | Freq: Once | INTRAMUSCULAR | Status: AC
  Administered 2018-08-06: 150 mg via INTRAMUSCULAR

## 2018-08-06 NOTE — Progress Notes (Signed)
Subjective:    CC: HTN  HPI: Hypertension- Pt denies chest pain, SOB, dizziness, or heart palpitations.  Taking meds as directed w/o problems.  Denies medication side effects.    Migraine HA -doing well overall.  Not currently on prophylaxis.  F/U fibromyalgia -  She is doing well overall.  She still has pain daily but is not on any medications currently she tries to work on stretches and uses her TENS unit as needed.  Does occasionally use ibuprofen as needed for pain.  She has not been sleeping well for the last 2-3 months.  She recently had traveled to [place] for classes and it was a very stressful situation ever since then she does have difficulty with sleep maintenance in particular.  She can usually fall asleep for about 2 hours and then wake up and has difficulty going back to sleep so she is not feeling well rested.  Past medical history, Surgical history, Family history not pertinant except as noted below, Social history, Allergies, and medications have been entered into the medical record, reviewed, and corrections made.   Review of Systems: No fevers, chills, night sweats, weight loss, chest pain, or shortness of breath.   Objective:    General: Well Developed, well nourished, and in no acute distress.  Neuro: Alert and oriented x3, extra-ocular muscles intact, sensation grossly intact.  HEENT: Normocephalic, atraumatic  Skin: Warm and dry, no rashes. Cardiac: Regular rate and rhythm, no murmurs rubs or gallops, no lower extremity edema.  Respiratory: Clear to auscultation bilaterally. Not using accessory muscles, speaking in full sentences.   Impression and Recommendations:    HTN - Well controlled. Continue current regimen. Follow up in  6 months.    Migraine HA - Not on prophylaxis, but doing well..    Fibromyalgia -stable with current use of ibuprofen TENS unit and stretches.  She is not currently wanting to be on any medication.  Insomnia-we discussed options.  She  did not do well with the trazodone so recommend a trial of Belsomra.  Prescription sent for 10 tabs of the 10 mg dose to try.  She will call us back and we can let us know if it is helpful and if she is able to tolerate it or if we need to adjust her dose.

## 2018-08-06 NOTE — Progress Notes (Signed)
Pt received depo injection today. She tolerated well,injection given in LD. Pt advised to RTC btw 1/13-1/27/2020 for next injection.Heath Gold, CMA

## 2018-08-08 ENCOUNTER — Telehealth: Payer: Self-pay | Admitting: Family Medicine

## 2018-08-09 ENCOUNTER — Telehealth: Payer: Self-pay | Admitting: Family Medicine

## 2018-08-09 MED ORDER — TRAZODONE HCL 50 MG PO TABS: 25.0000 mg | ORAL_TABLET | Freq: Every evening | ORAL | 1 refills | Status: DC | PRN

## 2018-08-09 MED ORDER — IBUPROFEN 800 MG PO TABS: 800.0000 mg | ORAL_TABLET | Freq: Three times a day (TID) | ORAL | 2 refills | Status: DC | PRN

## 2018-08-09 NOTE — Telephone Encounter (Signed)
Refill for IBU and trazodone sent.Marland KitchenMarland KitchenMarland KitchenLoralee Pacas [place]

## 2018-08-09 NOTE — Telephone Encounter (Signed)
error 

## 2018-08-09 NOTE — Telephone Encounter (Signed)
I was working on the PA and the patient will have to try and fail Zolpedium tablet before she can start on Belsomra. Please advise.

## 2018-08-09 NOTE — Telephone Encounter (Signed)
Please call patient and see if she is okay with trying a low-dose of generic Ambien.  Please let her know that her insurance will not cover the Belsomra.

## 2018-08-09 NOTE — Telephone Encounter (Signed)
Patient does not want to try the Ambien and wants to go back on the Tramadol and is requesting a refill of Ibuprofen as well. Pharmacy on file is correct. Please advise.

## 2018-08-09 NOTE — Telephone Encounter (Addendum)
Pt called. She was still not able to get the medication(texted to number provided by [place])..[facility] told [facility] medication needs to be pre[facility]. Thanks!

## 2018-10-15 ENCOUNTER — Other Ambulatory Visit: Payer: Self-pay | Admitting: Family Medicine

## 2018-10-15 DIAGNOSIS — I10 Essential (primary) hypertension: Secondary | ICD-10-CM

## 2018-10-19 ENCOUNTER — Encounter: Payer: Self-pay | Admitting: Family Medicine

## 2018-10-19 ENCOUNTER — Ambulatory Visit (INDEPENDENT_AMBULATORY_CARE_PROVIDER_SITE_OTHER): Payer: Medicaid Other | Admitting: Family Medicine

## 2018-10-19 VITALS — BP 113/77 | HR 94 | Ht 64.0 in | Wt 155.0 lb

## 2018-10-19 DIAGNOSIS — R102 Pelvic and perineal pain: Secondary | ICD-10-CM | POA: Diagnosis not present

## 2018-10-19 DIAGNOSIS — N760 Acute vaginitis: Secondary | ICD-10-CM

## 2018-10-19 LAB — WET PREP FOR TRICH, YEAST, CLUE
MICRO NUMBER:: 39055
SPECIMEN QUALITY 3963: ADEQUATE

## 2018-10-19 LAB — POCT URINE PREGNANCY: Preg Test, Ur: NEGATIVE

## 2018-10-19 NOTE — Progress Notes (Signed)
Acute Office Visit  Subjective:    Patient ID: Brianna Mendoza, female    DOB: [birth year], 38 y.o.   MRN: [id number]  Chief Complaint  Patient presents with  . Abdominal Pain    HPI Patient is in today for vaginal d/c and some mild abdominal pain. She  Has recently been sexually active but did have a condom break.  She recently reconnected with someone she met in high school.   She has had some nausea and flank pain.   Complains of some suprapubic pain.  Last night she actually says the pain was severe enough that it woke her up around 3 AM and she has not gone back to sleep since then but it has decreased in intensity.  No fevers chills or sweats.  She denies any constipation or blood.  She is not had any dysuria.  He is concerned that she could be pregnant as she had similar abdominal pain when she was pregnant with her daughter.  For sleep she is no longer using trazodone. She is using valerian root and melatonin.    Past Medical History:  Diagnosis Date  . Hypertension     Past Surgical History:  Procedure Laterality Date  . LTCS      Family History  Problem Relation Age of Onset  . Hypertension Mother   . Hypertension Brother   . Hypertension Sister     Social History   Socioeconomic History  . Marital status: Single    Spouse name: Not on file  . Number of children: Not on file  . Years of education: Not on file  . Highest education level: Not on file  Occupational History  . Not on file  Social Needs  . Financial resource strain: Not on file  . Food insecurity:    Worry: Not on file    Inability: Not on file  . Transportation needs:    Medical: Not on file    Non-medical: Not on file  Tobacco Use  . Smoking status: Current Every Day Smoker    Packs/day: 0.25    Years: 4.00    Pack years: 1.00    Types: Cigarettes  . Smokeless tobacco: Never Used  Substance and Sexual Activity  . Alcohol use: No    Alcohol/week: 0.0 standard drinks  . Drug use: No   . 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 Physical activity:    Days per week: Not on file    Minutes per session: Not on file  . Stress: Not on file  Relationships  . Social connections:    Talks on phone: Not on file    Gets together: Not on file    Attends religious service: Not on file    Active member of club or organization: Not on file    Attends meetings of clubs or organizations: Not on file    Relationship status: Not on file  . Intimate partner violence:    Fear of current or ex partner: Not on file    Emotionally abused: Not on file    Physically abused: Not on file    Forced sexual activity: Not on file  Other Topics Concern  . 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 AMBULATORY NON FORMULARY MEDICATION Medication Name: Trace minerals    . fluticasone (FLONASE) 50 MCG/ACT nasal spray Place 2 sprays into both nostrils daily. 16 g 6  . hydrochlorothiazide (MICROZIDE) 12.5 MG capsule TAKE 1 CAPSULE BY MOUTH EVERY DAY 90 capsule 0  . ibuprofen (ADVIL,MOTRIN) 800 MG tablet Take 1 tablet (800 mg total) by mouth every 8 (eight) hours as needed. 30 tablet 2  . medroxyPROGESTERone (DEPO-PROVERA) 150 MG/ML injection Inject 150 mg into the muscle every 3 (three) months.      Marland Kitchen PROAIR HFA 108 (90 Base) MCG/ACT inhaler TAKE 2 PUFFS BY MOUTH EVERY 6 HOURS AS NEEDED FOR WHEEZE 8.5 Inhaler 2  . traZODone (DESYREL) 50 MG tablet Take 0.5-2 tablets (25-100 mg total) by mouth at bedtime as needed for sleep. 60 tablet 1   No facility-administered medications prior to visit.     No Known Allergies  ROS     Objective:    Physical Exam  Constitutional: She is oriented to person, place,  and time. She appears well-developed and well-nourished.  HENT:  Head: Normocephalic and atraumatic.  Cardiovascular: Normal rate, regular rhythm and normal heart sounds.  Pulmonary/Chest: Effort normal and breath sounds normal.  Abdominal: Soft. Bowel sounds are normal. She exhibits no distension and no mass. There is abdominal tenderness. There is no rebound and no guarding.  Mild suprapubic tenderness with no rebound or guarding.  Neurological: She is alert and oriented to person, place, and time.  Skin: Skin is warm and dry.  Psychiatric: She has a normal mood and affect. Her behavior is normal.    BP 113/77   Pulse 94   Ht '5\' 4"'  (1.626 m)   Wt 155 lb (70.3 kg)   SpO2 100%   BMI 26.61 kg/m  Wt Readings from Last 3 Encounters:  10/19/18 155 lb (70.3 kg)  08/06/18 153 lb (69.4 kg)  05/21/18 154 lb (69.9 kg)    There are no preventive care reminders to display for this patient.  There are no preventive care reminders to display for this patient.   Lab Results  Component Value Date   TSH 1.46 01/23/2018   Lab Results  Component Value Date   WBC 4.2 03/22/2017   HGB 13.8 03/22/2017   HCT 42.1 03/22/2017   MCV 90.1 03/22/2017   PLT 303 03/22/2017   Lab Results  Component Value Date   NA 141 01/23/2018   K 3.6 01/23/2018   CO2 28 01/23/2018   GLUCOSE 100 (H) 01/23/2018   BUN 10 01/23/2018   CREATININE 1.06 01/23/2018   BILITOT 0.4 01/23/2018   ALKPHOS 92 09/07/2015   AST 12 01/23/2018   ALT 9 01/23/2018   PROT 6.4 01/23/2018   ALBUMIN 4.2 09/07/2015   CALCIUM 9.4 01/23/2018   Lab Results  Component Value Date   CHOL 147 01/23/2018   Lab Results  Component Value Date   HDL 45 (L) 01/23/2018   Lab Results  Component Value Date   LDLCALC 85 01/23/2018   Lab Results  Component Value Date   TRIG 82 01/23/2018   Lab Results  Component Value Date   CHOLHDL 3.3 01/23/2018   Lab Results  Component Value Date   HGBA1C 5.2 01/29/2018        Assessment & Plan:   Problem List Items Addressed This Visit    None    Visit Diagnoses    Pelvic pain    -  Primary   Relevant Orders   POCT urine pregnancy (Completed)   C. trachomatis/N. gonorrhoeae RNA   RPR   HIV Antibody (routine testing w rflx)   Acute vaginitis       Relevant Orders   WET PREP FOR [place], YEAST, CLUE     Urine pregnancy is negative.  For reassurance.  Will await urine GC chlamydia and wet prep.  Offered RPR and HIV testing through blood work and she agreed to go.  Will call with results once available.  If everything comes back normal then consider pelvic ultrasound and urinalysis.   No orders of the defined types were placed in this encounter.    Beatrice Lecher, MD

## 2018-10-20 LAB — C. TRACHOMATIS/N. GONORRHOEAE RNA
C. trachomatis RNA, TMA: NOT DETECTED
N. gonorrhoeae RNA, TMA: NOT DETECTED

## 2018-10-22 LAB — HIV ANTIBODY (ROUTINE TESTING W REFLEX): HIV 1&2 Ab, 4th Generation: NONREACTIVE

## 2018-10-22 LAB — RPR: RPR: NONREACTIVE

## 2018-10-23 ENCOUNTER — Ambulatory Visit (INDEPENDENT_AMBULATORY_CARE_PROVIDER_SITE_OTHER): Payer: Medicaid Other | Admitting: Family Medicine

## 2018-10-23 VITALS — BP 126/88 | HR 88 | Temp 98.2°F | Wt 154.0 lb

## 2018-10-23 DIAGNOSIS — Z3042 Encounter for surveillance of injectable contraceptive: Secondary | ICD-10-CM

## 2018-10-23 MED ORDER — MEDROXYPROGESTERONE ACETATE 150 MG/ML IM SUSP
150.0000 mg | Freq: Once | INTRAMUSCULAR | Status: AC
  Administered 2018-10-23: 150 mg via INTRAMUSCULAR

## 2018-10-23 NOTE — Progress Notes (Signed)
Agree with documentation as above.   Catherine Metheney, MD  

## 2018-10-23 NOTE — Progress Notes (Signed)
Pt in today for DepoProvera. Depo given in left deltoid with no complications. Pt advised of recent lab work and given copies, Pt stated she was no longer having pain, and feeling better. Will forward message to provider.

## 2018-12-10 ENCOUNTER — Other Ambulatory Visit: Payer: Self-pay | Admitting: Family Medicine

## 2018-12-29 ENCOUNTER — Other Ambulatory Visit: Payer: Self-pay | Admitting: Family Medicine

## 2018-12-31 ENCOUNTER — Telehealth: Payer: Self-pay | Admitting: Family Medicine

## 2018-12-31 ENCOUNTER — Other Ambulatory Visit: Payer: Self-pay | Admitting: *Deleted

## 2018-12-31 MED ORDER — IBUPROFEN 800 MG PO TABS: 800.0000 mg | ORAL_TABLET | Freq: Three times a day (TID) | ORAL | 0 refills | Status: DC | PRN

## 2018-12-31 NOTE — Telephone Encounter (Signed)
Patient calling in stating that she needs a refill on ibuprofen (ADVIL,MOTRIN) 800 MG tablet [[id number]] called [facility] to [id number] - [place], [place] - [address]

## 2018-12-31 NOTE — Progress Notes (Signed)
refill 

## 2019-01-09 ENCOUNTER — Other Ambulatory Visit: Payer: Self-pay

## 2019-01-09 ENCOUNTER — Ambulatory Visit (INDEPENDENT_AMBULATORY_CARE_PROVIDER_SITE_OTHER): Payer: Medicaid Other | Admitting: Family Medicine

## 2019-01-09 VITALS — BP 127/88 | HR 80

## 2019-01-09 DIAGNOSIS — Z3042 Encounter for surveillance of injectable contraceptive: Secondary | ICD-10-CM

## 2019-01-09 MED ORDER — MEDROXYPROGESTERONE ACETATE 150 MG/ML IM SUSP
150.0000 mg | Freq: Once | INTRAMUSCULAR | Status: AC
  Administered 2019-01-09: 08:00:00 150 mg via INTRAMUSCULAR

## 2019-01-09 NOTE — Progress Notes (Signed)
Agree with documentation as above.   Jerry Clyne, MD  

## 2019-01-09 NOTE — Progress Notes (Signed)
Pt came into clinic today for her depo shotand PPD placement. Pt is within appropriate window for administration. Pt tolerated injection in left deltoid(per request)well, no immediate complications. Printed copy of next injection window provided and appointment schedule prior to leaving clinic. No further questions or concerns.

## 2019-01-25 ENCOUNTER — Other Ambulatory Visit: Payer: Self-pay | Admitting: Family Medicine

## 2019-02-27 ENCOUNTER — Other Ambulatory Visit: Payer: Self-pay | Admitting: Family Medicine

## 2019-04-08 ENCOUNTER — Ambulatory Visit (INDEPENDENT_AMBULATORY_CARE_PROVIDER_SITE_OTHER): Payer: Medicaid Other | Admitting: Family Medicine

## 2019-04-08 VITALS — BP 124/85 | HR 92 | Ht 64.0 in | Wt 166.0 lb

## 2019-04-08 DIAGNOSIS — Z3042 Encounter for surveillance of injectable contraceptive: Secondary | ICD-10-CM | POA: Diagnosis not present

## 2019-04-08 MED ORDER — MEDROXYPROGESTERONE ACETATE 150 MG/ML IM SUSP
150.0000 mg | Freq: Once | INTRAMUSCULAR | Status: AC
  Administered 2019-04-08: 15:00:00 150 mg via INTRAMUSCULAR

## 2019-04-08 NOTE — Progress Notes (Signed)
   Subjective:    Patient ID: Brianna Mendoza, female    DOB: 18-Nov-1980, 38 y.o.   MRN: [id number]  HPI Patient is here for a Depo Provera injection. Denies chest pain, shortness of breath, headaches, mood changes or problems with medication. It has been 11-14 weeks since her last Depo Provera injection   Review of Systems     Objective:   Physical Exam        Assessment & Plan:  Patient tolerated injection well without complications. Patient advised to schedule next injection 11-14 weeks from today.

## 2019-04-08 NOTE — Progress Notes (Signed)
Agree with documentation as above.   Catherine Metheney, MD  

## 2019-04-11 ENCOUNTER — Other Ambulatory Visit: Payer: Self-pay | Admitting: Family Medicine

## 2019-04-11 DIAGNOSIS — I10 Essential (primary) hypertension: Secondary | ICD-10-CM

## 2019-04-12 ENCOUNTER — Other Ambulatory Visit: Payer: Self-pay | Admitting: Family Medicine

## 2019-06-07 ENCOUNTER — Ambulatory Visit (INDEPENDENT_AMBULATORY_CARE_PROVIDER_SITE_OTHER): Payer: Medicaid Other | Admitting: Family Medicine

## 2019-06-07 ENCOUNTER — Encounter: Payer: Self-pay | Admitting: Family Medicine

## 2019-06-07 DIAGNOSIS — R232 Flushing: Secondary | ICD-10-CM

## 2019-06-07 DIAGNOSIS — R109 Unspecified abdominal pain: Secondary | ICD-10-CM

## 2019-06-07 DIAGNOSIS — L689 Hypertrichosis, unspecified: Secondary | ICD-10-CM | POA: Diagnosis not present

## 2019-06-07 DIAGNOSIS — R5383 Other fatigue: Secondary | ICD-10-CM

## 2019-06-07 NOTE — Progress Notes (Signed)
Virtual Visit via Video Note  I connected with Brianna DrossLaParis Vitullo on 06/07/19 at  1:40 PM EDT by a video enabled telemedicine application and verified that I am speaking with the correct person using two identifiers.   I discussed the limitations of evaluation and management by telemedicine and the availability of in person appointments. The patient expressed understanding and agreed to proceed.   Established Patient Office Visit  Subjective:  Patient ID: Brianna Mendoza, female    DOB: Jan 21, 1981  Age: 38 y.o. MRN: [id number]  CC:  Chief Complaint  Patient presents with  . Fatigue    HPI Brianna Ladona Ridgelaylor has a history of migraines and hypertension and fibromyalgia who presents for fatigue and hot flashes.  She reports that over the last couple of months she has been having hot flashes which she is never really had before.  She says the last about 10 minutes or so and then go away but they were recurrent.  She had several about a month or so ago and the counter got a little better and now have become more frequent again.  She says she just feels like she is got a lot of stomach cramps and low back pain and just feels achy in her legs almost like she is going to start her.  But has not had a period in quite a long time since having had her IUD placed.  She denies having any vaginal spotting she is usually using ibuprofen for pain relief and it does help some.  She says she just feels really tired and even had a day earlier this week where she just felt really groggy and irritable which just was not like herself.  Though with her new job she has been getting up at 3 AM and working a very early shift and then gets home around 1 in the afternoon and tries to sleep.  She is also had some additional stressors.  She said she has a cousin who was shot multiple times and died and also her grandmother was recently placed on hospice plus just the stress of COVID.  She does like her new job go.  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  She just feels like her fibromyalgia is flaring again.  She has not had any significant changes in her diet.  She did start some vitamin D in hopes that it would help with the hot flashes and says she does feel like it may have helped some.  She also complains of hot flashes.  Past Medical History:  Diagnosis Date  . Hypertension     Past Surgical History:  Procedure Laterality Date  . LTCS      Family History  Problem Relation Age of Onset  . Hypertension Mother   . Hypertension Brother   . Hypertension Sister     Social History   Socioeconomic History  . Marital status: Single    Spouse name: Not on file  . Number of children: Not on file  . Years of education: Not on file  . Highest education level: Not on file  Occupational History  . Not on file  Social Needs  . Financial resource strain: Not on file  . Food insecurity    Worry: Not on file    Inability: Not on file  . Transportation needs    Medical: Not on file    Non-medical: Not on file  Tobacco Use  . Smoking status: Current Every Day Smoker  Packs/day: 0.25    Years: 4.00    Pack years: 1.00    Types: Cigarettes  . Smokeless tobacco: Never Used  Substance and Sexual Activity  . Alcohol use: No    Alcohol/week: 0.0 standard drinks  . Drug use: No  . 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 Physical activity    Days per week: Not on file    Minutes per session: Not on file  . Stress: Not on file  Relationships  . Social Herbalist on phone: Not on file    Gets together: Not on file    Attends religious service: Not on file    Active member of club or organization: Not on file    Attends meetings of clubs or organizations: Not on file    Relationship status: Not on file  . Intimate partner violence    Fear of current  or ex partner: Not on file    Emotionally abused: Not on file    Physically abused: Not on file    Forced sexual activity: Not on file  Other Topics Concern  . 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 AMBULATORY NON FORMULARY MEDICATION Medication Name: Trace minerals    . fluticasone (FLONASE) 50 MCG/ACT nasal spray Place 2 sprays into both nostrils daily. 16 g 6  . hydrochlorothiazide (MICROZIDE) 12.5 MG capsule TAKE 1 CAPSULE BY MOUTH EVERY DAY 90 capsule 1  . ibuprofen (ADVIL) 800 MG tablet TAKE 1 TABLET BY MOUTH EVERY 8 HOURS AS NEEDED 90 tablet 1  . medroxyPROGESTERone (DEPO-PROVERA) 150 MG/ML injection Inject 150 mg into the muscle every 3 (three) months.      Marland Kitchen PROAIR HFA 108 (90 Base) MCG/ACT inhaler TAKE 2 PUFFS BY MOUTH EVERY 6 HOURS AS NEEDED FOR WHEEZE 8.5 Inhaler 2   No facility-administered medications prior to visit.     No Known Allergies  ROS Review of Systems    Objective:    Physical Exam  Constitutional: She is oriented to person, place, and time. She appears well-developed and well-nourished.  HENT:  Head: Normocephalic and atraumatic.  Eyes: EOM are normal.  Pulmonary/Chest: Effort normal.  Neurological: She is alert and oriented to person, place, and time.  Skin: No pallor.  Psychiatric: She has a normal mood and affect. Her behavior is normal.  Vitals reviewed.   There were no vitals taken for this visit. Wt Readings from Last 3 Encounters:  04/08/19 166 lb (75.3 kg)  10/23/18 154 lb (69.9 kg)  10/19/18 155 lb (70.3 kg)     There are no preventive care reminders to display for this patient.  There are no preventive care reminders to display for this patient.  Lab Results  Component Value Date   TSH 1.46 01/23/2018   Lab Results  Component Value Date   WBC 4.2 03/22/2017   HGB  13.8 03/22/2017   HCT 42.1 03/22/2017   MCV 90.1 03/22/2017   PLT 303 03/22/2017   Lab Results  Component Value Date   NA 141 01/23/2018   K 3.6 01/23/2018   CO2 28 01/23/2018   GLUCOSE 100 (H) 01/23/2018   BUN 10 01/23/2018  CREATININE 1.06 01/23/2018   BILITOT 0.4 01/23/2018   ALKPHOS 92 09/07/2015   AST 12 01/23/2018   ALT 9 01/23/2018   PROT 6.4 01/23/2018   ALBUMIN 4.2 09/07/2015   CALCIUM 9.4 01/23/2018   Lab Results  Component Value Date   CHOL 147 01/23/2018   Lab Results  Component Value Date   HDL 45 (L) 01/23/2018   Lab Results  Component Value Date   LDLCALC 85 01/23/2018   Lab Results  Component Value Date   TRIG 82 01/23/2018   Lab Results  Component Value Date   CHOLHDL 3.3 01/23/2018   Lab Results  Component Value Date   HGBA1C 5.2 01/29/2018      Assessment & Plan:   Problem List Items Addressed This Visit    None    Visit Diagnoses    Stomach cramps    -  Primary   Relevant Orders   COMPLETE METABOLIC PANEL WITH GFR   CBC   TSH   Estradiol   Follicle stimulating hormone   Luteinizing hormone   Progesterone   Hemoglobin A1c   VITAMIN D 25 Hydroxy (Vit-D Deficiency, Fractures)   Ferritin   B12   Fatigue, unspecified type       Relevant Orders   COMPLETE METABOLIC PANEL WITH GFR   CBC   TSH   Estradiol   Follicle stimulating hormone   Luteinizing hormone   Progesterone   Hemoglobin A1c   VITAMIN D 25 Hydroxy (Vit-D Deficiency, Fractures)   Ferritin   B12   Hot flashes       Relevant Orders   COMPLETE METABOLIC PANEL WITH GFR   CBC   TSH   Estradiol   Follicle stimulating hormone   Luteinizing hormone   Progesterone   Hemoglobin A1c   VITAMIN D 25 Hydroxy (Vit-D Deficiency, Fractures)   Ferritin   B12   Excessive hair growth       Relevant Orders   COMPLETE METABOLIC PANEL WITH GFR   CBC   TSH   Estradiol   Follicle stimulating hormone   Luteinizing hormone   Progesterone   Hemoglobin A1c   VITAMIN D  25 Hydroxy (Vit-D Deficiency, Fractures)   Ferritin   B12      Stomach cramps-she says they are almost like menstrual cramps.  She has not had any nausea vomiting or diarrhea with it.  We just discussed possibly doing a pelvic ultrasound.  She wants to hold off and get labs first and then consider moving forward with that.  Increase in facial hair growth-certainly could be hormone related so we will check those labs.  Fibromyalgia-it does sound like some component of this is a fibromyalgia flare.  We did discuss that we may need to consider going back on medication which we had done previously at one point in time. I do wonder if her early sleep schedule and lack of rest is contributing.  Hot flashes-certainly could be hormonal.  She could be going into menopause prematurely.  She does have a progesterone IUD in place.  Fatigue-we will evaluate for anemia and thyroid disorder.  Again may be related to fibromyalgia and recent stressors.   No orders of the defined types were placed in this encounter.   Follow-up: No follow-ups on file.   I discussed the assessment and treatment plan with the patient. The patient was provided an opportunity to ask questions and all were answered. The patient agreed with the plan and demonstrated an understanding  of the instructions.   The patient was advised to call back or seek an in-person evaluation if the symptoms worsen or if the condition fails to improve as anticipated.   Nani Gasser, MD

## 2019-06-07 NOTE — Progress Notes (Signed)
She reports that she has been taking several vit supplements And something for energy.  sxs x 2 mos   End of July she reports that she began having episodes of feeling groggy,abd cramping, back pain, headaches, hot flashes. She feels off balance and weird. She stated that she shaved some hair off of her chin and by the next day she stated that hair was back. She feels that her hormones are off.  She said that she has been stressed lately. She had a cousin that died and her grandmother is on hospice.  Maryruth Eve, Lahoma Crocker, CMA

## 2019-06-08 LAB — CBC
HCT: 40.4 % (ref 35.0–45.0)
Hemoglobin: 13.8 g/dL (ref 11.7–15.5)
MCH: 31.7 pg (ref 27.0–33.0)
MCHC: 34.2 g/dL (ref 32.0–36.0)
MCV: 92.9 fL (ref 80.0–100.0)
MPV: 11.5 fL (ref 7.5–12.5)
Platelets: 305 10*3/uL (ref 140–400)
RBC: 4.35 10*6/uL (ref 3.80–5.10)
RDW: 12.2 % (ref 11.0–15.0)
WBC: 6 10*3/uL (ref 3.8–10.8)

## 2019-06-08 LAB — COMPLETE METABOLIC PANEL WITH GFR
AG Ratio: 1.8 (calc) (ref 1.0–2.5)
ALT: 9 U/L (ref 6–29)
AST: 12 U/L (ref 10–30)
Albumin: 3.9 g/dL (ref 3.6–5.1)
Alkaline phosphatase (APISO): 79 U/L (ref 31–125)
BUN/Creatinine Ratio: 14 (calc) (ref 6–22)
BUN: 16 mg/dL (ref 7–25)
CO2: 25 mmol/L (ref 20–32)
Calcium: 9 mg/dL (ref 8.6–10.2)
Chloride: 108 mmol/L (ref 98–110)
Creat: 1.16 mg/dL — ABNORMAL HIGH (ref 0.50–1.10)
GFR, Est African American: 69 mL/min/{1.73_m2} (ref >=60)
GFR, Est Non African American: 59 mL/min/{1.73_m2} — ABNORMAL LOW (ref >=60)
Globulin: 2.2 g/dL (calc) (ref 1.9–3.7)
Glucose, Bld: 89 mg/dL (ref 65–99)
Potassium: 2.9 mmol/L — ABNORMAL LOW (ref 3.5–5.3)
Sodium: 141 mmol/L (ref 135–146)
Total Bilirubin: 0.4 mg/dL (ref 0.2–1.2)
Total Protein: 6.1 g/dL (ref 6.1–8.1)

## 2019-06-08 LAB — PROGESTERONE: Progesterone: 0.5 ng/mL

## 2019-06-08 LAB — FERRITIN: Ferritin: 42 ng/mL (ref 16–154)

## 2019-06-08 LAB — VITAMIN B12: Vitamin B-12: 228 pg/mL (ref 200–1100)

## 2019-06-08 LAB — HEMOGLOBIN A1C
Hgb A1c MFr Bld: 5.1 % of total Hgb (ref <5.7)
Mean Plasma Glucose: 100 (calc)
eAG (mmol/L): 5.5 (calc)

## 2019-06-08 LAB — ESTRADIOL: Estradiol: 42 pg/mL

## 2019-06-08 LAB — FOLLICLE STIMULATING HORMONE: FSH: 5.9 m[IU]/mL

## 2019-06-08 LAB — LUTEINIZING HORMONE: LH: 5 m[IU]/mL

## 2019-06-08 LAB — VITAMIN D 25 HYDROXY (VIT D DEFICIENCY, FRACTURES): Vit D, 25-Hydroxy: 21 ng/mL — ABNORMAL LOW (ref 30–100)

## 2019-06-08 LAB — TSH: TSH: 1.15 mIU/L

## 2019-06-11 MED ORDER — POTASSIUM CHLORIDE ER 10 MEQ PO TBCR: 10.0000 meq | EXTENDED_RELEASE_TABLET | Freq: Every day | ORAL | 0 refills | Status: DC

## 2019-06-11 NOTE — Addendum Note (Signed)
Addended by: Beatrice Lecher D on: 06/11/2019 12:12 PM   Modules accepted: Orders

## 2019-06-13 ENCOUNTER — Other Ambulatory Visit: Payer: Self-pay | Admitting: *Deleted

## 2019-06-13 ENCOUNTER — Other Ambulatory Visit: Payer: Self-pay | Admitting: Family Medicine

## 2019-06-13 DIAGNOSIS — E538 Deficiency of other specified B group vitamins: Secondary | ICD-10-CM

## 2019-06-13 DIAGNOSIS — R7989 Other specified abnormal findings of blood chemistry: Secondary | ICD-10-CM

## 2019-06-25 ENCOUNTER — Other Ambulatory Visit: Payer: Self-pay

## 2019-06-25 ENCOUNTER — Ambulatory Visit (INDEPENDENT_AMBULATORY_CARE_PROVIDER_SITE_OTHER): Payer: Medicaid Other | Admitting: Family Medicine

## 2019-06-25 VITALS — BP 151/93 | HR 80 | Wt 160.0 lb

## 2019-06-25 DIAGNOSIS — Z3042 Encounter for surveillance of injectable contraceptive: Secondary | ICD-10-CM | POA: Diagnosis not present

## 2019-06-25 NOTE — Progress Notes (Signed)
Agree with documentation as above.  We will just continue to monitor blood pressure.  The last 2 previous visits it looks phenomenal.  Just encouraged her to work on stress reduction.  Beatrice Lecher, MD

## 2019-06-25 NOTE — Progress Notes (Signed)
   Subjective:    Patient ID: Brianna Mendoza, female    DOB: [birth year], 38 y.o.   MRN: [id number]  HPI Patient is here for a Depo Provera injection. Denies chest pain, shortness of breath, headaches, mood changes or problems with medication. It has been in range between 11-14 weeks since her last Depo Provera injection    Review of Systems     Objective:   Physical Exam        Assessment & Plan:  Patient tolerated injection well without complications. Patient advised to schedule next injection 11-14 weeks from today.   Initial blood pressure reading was elevated at 151/93. Patient sat in room and rested for 5 minutes and blood pressure only came down to 146/90. Patient states she is taking her HCTZ and took it this morning. She reports she feels it is stress (as discussed in her last office note) and because all she has had this morning was coffee. Please advise on blood pressure

## 2019-06-25 NOTE — Progress Notes (Signed)
Pt advised.

## 2019-09-02 ENCOUNTER — Other Ambulatory Visit: Payer: Self-pay | Admitting: Family Medicine

## 2019-09-10 ENCOUNTER — Ambulatory Visit (INDEPENDENT_AMBULATORY_CARE_PROVIDER_SITE_OTHER): Payer: Medicaid Other | Admitting: Family Medicine

## 2019-09-10 ENCOUNTER — Other Ambulatory Visit: Payer: Self-pay

## 2019-09-10 ENCOUNTER — Ambulatory Visit: Payer: Medicaid Other

## 2019-09-10 VITALS — Temp 97.8°F | Ht 64.0 in | Wt 160.0 lb

## 2019-09-10 DIAGNOSIS — Z3042 Encounter for surveillance of injectable contraceptive: Secondary | ICD-10-CM | POA: Diagnosis not present

## 2019-09-10 MED ORDER — MEDROXYPROGESTERONE ACETATE 150 MG/ML IM SUSP
150.0000 mg | Freq: Once | INTRAMUSCULAR | Status: AC
  Administered 2019-09-10: 14:00:00 150 mg via INTRAMUSCULAR

## 2019-09-10 NOTE — Patient Instructions (Signed)
Paper was given out with the next dates to give the next injection.

## 2019-09-10 NOTE — Progress Notes (Signed)
Agree with documentation as above.   Arrian Manson, MD  

## 2019-09-10 NOTE — Progress Notes (Signed)
Patient presents to clinic for a Depo Provera injection. Patient has requested that the injection be given in the right deltoid. Patient was given the injection in her Right Deltoid with no immediate complications. She requested an alcohol pad be placed on the skin and then the band-aid. Patient was given handout with dates of next injection. She did not have any other questions.

## 2019-10-03 ENCOUNTER — Other Ambulatory Visit: Payer: Self-pay | Admitting: Family Medicine

## 2019-10-03 DIAGNOSIS — I10 Essential (primary) hypertension: Secondary | ICD-10-CM

## 2019-11-11 ENCOUNTER — Encounter: Payer: Self-pay | Admitting: Family Medicine

## 2019-11-11 ENCOUNTER — Telehealth (INDEPENDENT_AMBULATORY_CARE_PROVIDER_SITE_OTHER): Payer: Medicaid Other | Admitting: Family Medicine

## 2019-11-11 DIAGNOSIS — K2100 Gastro-esophageal reflux disease with esophagitis, without bleeding: Secondary | ICD-10-CM | POA: Diagnosis not present

## 2019-11-11 DIAGNOSIS — E538 Deficiency of other specified B group vitamins: Secondary | ICD-10-CM | POA: Diagnosis not present

## 2019-11-11 DIAGNOSIS — R7989 Other specified abnormal findings of blood chemistry: Secondary | ICD-10-CM

## 2019-11-11 DIAGNOSIS — M797 Fibromyalgia: Secondary | ICD-10-CM

## 2019-11-11 MED ORDER — PANTOPRAZOLE SODIUM 40 MG PO TBEC: 40.0000 mg | DELAYED_RELEASE_TABLET | Freq: Every day | ORAL | 1 refills | Status: DC

## 2019-11-11 NOTE — Progress Notes (Signed)
Acid reflux on Friday, spit up vitamins. Consistently refluxing up liquid and swallowed it. Started having stomach pains middle of stomach. Requesting blood work. Self discontinued potassium, never taken medication for this.  Out of work Friday and today.

## 2019-11-11 NOTE — Assessment & Plan Note (Signed)
She has had a increase in body aches and muscle aches lately.  No change to current treatment regimen at this time.

## 2019-11-11 NOTE — Progress Notes (Signed)
Virtual Visit via Video Note  I connected with Valere Dross on 11/11/19 at 10:50 AM EST by a video enabled telemedicine application and verified that I am speaking with the correct person using two identifiers.   I discussed the limitations of evaluation and management by telemedicine and the availability of in person appointments. The patient expressed understanding and agreed to proceed.  Subjective:    CC: reflux?   HPI: On Friday was taking her usual vitamin and says it came back up and treid to swallow it back down and happened again.  She says she usually takes her pills all at one time.  She does feel like it almost got stuck in that upper chest area temporarily.  Then drank some coffee and then went to work and started hurting in the middle of her stomach in the mid upper abdomen.  Happened last year.  Feels like might be stress related. When got home from work she had some Mint Tea and her stomach finally settle down.  Felt some better over the weekend. No dysphagia but does feels like got hung.  She says she decided to not take her vitamins this morning and she did not want to make anything worse since she was gradually starting to feel little bit better.  She did stat of work just to be sure.  No fevers chills or sweats.  No other GI symptoms.  She has been more fatigued with her fibromyalgia.   Had a neg COVID test last week bc of work exposure.    She is also been taking her supplements.  We did blood work back in August which revealed deficiencies in B12, potassium, and vitamin D.  She says she has been extremely consistent up until this weekend when she has purposely skips her doses just to make sure that she does not have any recurrence of what happened on Friday.  She has been feeling well except her fibromyalgia has been flaring a little bit more recently.   Past medical history, Surgical history, Family history not pertinant except as noted below, Social history, Allergies,  and medications have been entered into the medical record, reviewed, and corrections made.   Review of Systems: No fevers, chills, night sweats, weight loss, chest pain, or shortness of breath.   Objective:    General: Speaking clearly in complete sentences without any shortness of breath.  Alert and oriented x3.  Normal judgment. No apparent acute distress.    Impression and Recommendations:   GERD -it actually sounds like maybe her pills or vitamins actually got, punk in the upper chest area and then she spit them back up.  It sounds like after several attempts she finally was able to swallow them but then it made her stomach hurt.  Will tx with PPI for a couple a weeks and see if everything settles down.  If not she can let us know.  If symptoms persist then okay to continue a 30-day course of the PPI.  We discussed also eating when she takes her vitamins and supplements as that may help as well.    Vitamin D deficiency-due to recheck levels.  Vitamin B12 deficiency-due to recheck levels.  She also wanted know how long she needs to continue taking her potassium supplement.   I discussed the assessment and treatment plan with the patient. The patient was provided an opportunity to ask questions and all were answered. The patient agreed with the plan and demonstrated an understanding of the instructions.  The patient was advised to call back or seek an in-person evaluation if the symptoms worsen or if the condition fails to improve as anticipated.   Beatrice Lecher, MD

## 2019-11-14 ENCOUNTER — Other Ambulatory Visit: Payer: Self-pay | Admitting: Family Medicine

## 2019-11-26 ENCOUNTER — Ambulatory Visit (INDEPENDENT_AMBULATORY_CARE_PROVIDER_SITE_OTHER): Payer: Medicaid Other | Admitting: Family Medicine

## 2019-11-26 ENCOUNTER — Other Ambulatory Visit: Payer: Self-pay

## 2019-11-26 VITALS — Temp 98.5°F | Ht 64.0 in | Wt 160.0 lb

## 2019-11-26 DIAGNOSIS — Z3042 Encounter for surveillance of injectable contraceptive: Secondary | ICD-10-CM | POA: Diagnosis not present

## 2019-11-26 MED ORDER — MEDROXYPROGESTERONE ACETATE 150 MG/ML IM SUSP
150.0000 mg | Freq: Once | INTRAMUSCULAR | Status: AC
  Administered 2019-11-26: 13:00:00 150 mg via INTRAMUSCULAR

## 2019-11-26 NOTE — Patient Instructions (Signed)
Patient given handout of next dates for her next injection.

## 2019-11-26 NOTE — Progress Notes (Signed)
Agree with documentation as above.   Catherine Metheney, MD  

## 2019-11-26 NOTE — Progress Notes (Signed)
Patient presents to clinic for a Depo Provera injection. Patient has requested that the injection be given in the left deltoid. She tolerated the injection well in her left deltoid with no immediate complications. She requested an alcohol pad be placed between the bandaid and her arm. Patient was given handout with next dates to get her next depo injection.

## 2019-12-03 ENCOUNTER — Other Ambulatory Visit: Payer: Self-pay | Admitting: Family Medicine

## 2019-12-03 DIAGNOSIS — K2100 Gastro-esophageal reflux disease with esophagitis, without bleeding: Secondary | ICD-10-CM

## 2019-12-06 ENCOUNTER — Other Ambulatory Visit: Payer: Self-pay | Admitting: Family Medicine

## 2019-12-10 LAB — VITAMIN B12: Vitamin B-12: 293 pg/mL (ref 200–1100)

## 2019-12-10 LAB — COMPLETE METABOLIC PANEL WITH GFR
AG Ratio: 1.8 (calc) (ref 1.0–2.5)
ALT: 10 U/L (ref 6–29)
AST: 11 U/L (ref 10–30)
Albumin: 4.2 g/dL (ref 3.6–5.1)
Alkaline phosphatase (APISO): 81 U/L (ref 31–125)
BUN/Creatinine Ratio: 15 (calc) (ref 6–22)
BUN: 17 mg/dL (ref 7–25)
CO2: 28 mmol/L (ref 20–32)
Calcium: 9.3 mg/dL (ref 8.6–10.2)
Chloride: 106 mmol/L (ref 98–110)
Creat: 1.14 mg/dL — ABNORMAL HIGH (ref 0.50–1.10)
GFR, Est African American: 70 mL/min/{1.73_m2} (ref >=60)
GFR, Est Non African American: 61 mL/min/{1.73_m2} (ref >=60)
Globulin: 2.3 g/dL (calc) (ref 1.9–3.7)
Glucose, Bld: 89 mg/dL (ref 65–99)
Potassium: 3.4 mmol/L — ABNORMAL LOW (ref 3.5–5.3)
Sodium: 141 mmol/L (ref 135–146)
Total Bilirubin: 0.3 mg/dL (ref 0.2–1.2)
Total Protein: 6.5 g/dL (ref 6.1–8.1)

## 2019-12-10 LAB — VITAMIN D 25 HYDROXY (VIT D DEFICIENCY, FRACTURES): Vit D, 25-Hydroxy: 24 ng/mL — ABNORMAL LOW (ref 30–100)

## 2020-02-11 ENCOUNTER — Ambulatory Visit: Payer: Medicaid Other

## 2020-02-12 ENCOUNTER — Ambulatory Visit (INDEPENDENT_AMBULATORY_CARE_PROVIDER_SITE_OTHER): Payer: Medicaid Other | Admitting: Family Medicine

## 2020-02-12 ENCOUNTER — Other Ambulatory Visit: Payer: Self-pay

## 2020-02-12 DIAGNOSIS — Z3042 Encounter for surveillance of injectable contraceptive: Secondary | ICD-10-CM | POA: Diagnosis not present

## 2020-02-12 MED ORDER — MEDROXYPROGESTERONE ACETATE 150 MG/ML IM SUSP
150.0000 mg | Freq: Once | INTRAMUSCULAR | Status: AC
  Administered 2020-02-12: 150 mg via INTRAMUSCULAR

## 2020-02-12 NOTE — Progress Notes (Signed)
[name] is here for a Depo Provera injection. Denies chest pain, shortness of breath, headaches, mood changes or problems with medication. It has been < 14 weeks since her last Depo Provera injection.   She does state she is having issues with nausea and reflux. She feels like Protonix 40 mg daily isn't working as well as it did at first. She feels like when she eats, her food just sits at her diaphragm area. She states this gets worse near time for her Depo Provera injection. She also states low back pain and some spotting for the first time in almost 16 years since she started Depo Provera. She has an appointment on Monday to discuss these issues with Dr. Linford Arnold.   Depo Provera injection to left deltoid, per patient request, with no apparent complications. Patient given handout to schedule next injection between 04/29/2020-05/13/2020.

## 2020-02-12 NOTE — Progress Notes (Signed)
Medical screening examination/treatment was performed by qualified clinical staff member and as supervising physician I was immediately available for consultation/collaboration. I have reviewed documentation and agree with assessment and plan.  Jamauri Kruzel, DO  

## 2020-02-17 ENCOUNTER — Ambulatory Visit (INDEPENDENT_AMBULATORY_CARE_PROVIDER_SITE_OTHER): Payer: Medicaid Other | Admitting: Family Medicine

## 2020-02-17 ENCOUNTER — Encounter: Payer: Self-pay | Admitting: Family Medicine

## 2020-02-17 ENCOUNTER — Other Ambulatory Visit: Payer: Self-pay

## 2020-02-17 VITALS — BP 138/98 | HR 86 | Ht 64.0 in | Wt 159.0 lb

## 2020-02-17 DIAGNOSIS — M797 Fibromyalgia: Secondary | ICD-10-CM

## 2020-02-17 DIAGNOSIS — I10 Essential (primary) hypertension: Secondary | ICD-10-CM

## 2020-02-17 DIAGNOSIS — R109 Unspecified abdominal pain: Secondary | ICD-10-CM | POA: Diagnosis not present

## 2020-02-17 DIAGNOSIS — R062 Wheezing: Secondary | ICD-10-CM | POA: Diagnosis not present

## 2020-02-17 MED ORDER — IBUPROFEN 800 MG PO TABS: 800.0000 mg | ORAL_TABLET | Freq: Three times a day (TID) | ORAL | 1 refills | Status: DC | PRN

## 2020-02-17 MED ORDER — ALBUTEROL SULFATE HFA 108 (90 BASE) MCG/ACT IN AERS: INHALATION_SPRAY | RESPIRATORY_TRACT | 1 refills | Status: DC

## 2020-02-17 NOTE — Progress Notes (Signed)
Acute Office Visit  Subjective:    Patient ID: Brianna Mendoza, female    DOB: 31-Aug-1981, 39 y.o.   MRN: [id number]  Chief Complaint  Patient presents with  . Abdominal Pain    HPI Patient is in today for left sided abdominal pain.  Notices a fullness when she is due for her Depo.  She took some Tums and laxative and tea and that seemed to help after had a good BM this weekend.   She is actually feeling better.  She said she was just worried initially because she was having a lot of discomfort and cramping earlier in the week.  She admits she has not been quite as consistent with her his MiraLAX because she seemed to be going and thought she was emptying fairly well but then she had a very large bowel movement on Saturday.  Hypertension- Pt denies chest pain, SOB, dizziness, or heart palpitations.  Taking meds as directed w/o problems.  Denies medication side effects.    She still taking her supplements including vitamin D, vitamin D, and vitamin C.  Struggling some with upper shoulder and neck pain with her fibromyalgia.  She ran out of the ibuprofen 800 so had bought some 200s over-the-counter.  She is trying not to overuse them.  But says it does help.  Also using some heat.  Past Medical History:  Diagnosis Date  . Hypertension     Past Surgical History:  Procedure Laterality Date  . LTCS      Family History  Problem Relation Age of Onset  . Hypertension Mother   . Hypertension Brother   . Hypertension Sister     Social History   Socioeconomic History  . Marital status: Single    Spouse name: Not on file  . Number of children: Not on file  . Years of education: Not on file  . Highest education level: Not on file  Occupational History  . Not on file  Tobacco Use  . Smoking status: Current Every Day Smoker    Packs/day: 0.25    Years: 4.00    Pack years: 1.00    Types: Cigarettes  . Smokeless tobacco: Never Used  Substance and Sexual Activity  . Alcohol use:  No    Alcohol/week: 0.0 standard drinks  . Drug use: No  . 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 Difficulty of Paying Living Expenses:   Food Insecurity:   . Worried About Programme researcher, broadcasting/film/video in the Last Year:   . Barista in the Last Year:   Transportation Needs:   . Freight forwarder (Medical):   Marland Kitchen Lack of Transportation (Non-Medical):   Physical Activity:   . Days of Exercise per Week:   . Minutes of Exercise per Session:   Stress:   . Feeling of Stress :   Social Connections:   . Frequency of Communication with Friends and Family:   . Frequency of Social Gatherings with Friends and Family:   . Attends Religious Services:   . Active Member of Clubs or Organizations:   . Attends Banker  Meetings:   Marland Kitchen Marital Status:   Intimate Partner Violence:   . Fear of Current or Ex-Partner:   . Emotionally Abused:   Marland Kitchen Physically Abused:   . Sexually Abused:     Outpatient Medications Prior to Visit  Medication Sig Dispense Refill  . Ascorbic Acid (VITAMIN C) 100 MG tablet Take 100 mg by mouth daily.    . cholecalciferol (VITAMIN D3) 25 MCG (1000 UNIT) tablet Take 1,000 Units by mouth daily.    . vitamin E 1000 UNIT capsule Take 1,000 Units by mouth daily.    . AMBULATORY NON FORMULARY MEDICATION Medication Name: Trace minerals    . fluticasone (FLONASE) 50 MCG/ACT nasal spray Place 2 sprays into both nostrils daily. 16 g 6  . hydrochlorothiazide (MICROZIDE) 12.5 MG capsule TAKE 1 CAPSULE BY MOUTH EVERY DAY 90 capsule 1  . medroxyPROGESTERone (DEPO-PROVERA) 150 MG/ML injection Inject 150 mg into the muscle every 3  (three) months.      . pantoprazole (PROTONIX) 40 MG tablet TAKE 1 TABLET BY MOUTH EVERY DAY 90 tablet 3  . ibuprofen (ADVIL) 800 MG tablet TAKE 1 TABLET BY MOUTH EVERY 8 HOURS AS NEEDED 90 tablet 1  . PROAIR HFA 108 (90 Base) MCG/ACT inhaler TAKE 2 PUFFS BY MOUTH EVERY 6 HOURS AS NEEDED FOR WHEEZE 8.5 Inhaler 2   No facility-administered medications prior to visit.    No Known Allergies  Review of Systems     Objective:    Physical Exam Constitutional:      Appearance: She is well-developed.  HENT:     Head: Normocephalic and atraumatic.  Cardiovascular:     Rate and Rhythm: Normal rate and regular rhythm.     Heart sounds: Normal heart sounds.  Pulmonary:     Effort: Pulmonary effort is normal.     Breath sounds: Normal breath sounds.  Abdominal:     General: Bowel sounds are normal. There is no distension.     Palpations: Abdomen is soft. There is no mass.     Tenderness: There is no abdominal tenderness. There is no guarding.  Skin:    General: Skin is warm and dry.  Neurological:     Mental Status: She is alert and oriented to person, place, and time.  Psychiatric:        Behavior: Behavior normal.     BP (!) 138/98   Pulse 86   Ht 5\' 4"  (1.626 m)   Wt 159 lb (72.1 kg)   SpO2 99%   BMI 27.29 kg/m  Wt Readings from Last 3 Encounters:  02/17/20 159 lb (72.1 kg)  11/26/19 160 lb (72.6 kg)  09/10/19 160 lb (72.6 kg)    There are no preventive care reminders to display for this patient.  There are no preventive care reminders to display for this patient.   Lab Results  Component Value Date   TSH 1.15 06/07/2019   Lab Results  Component Value Date   WBC 6.0 06/07/2019   HGB 13.8 06/07/2019   HCT 40.4 06/07/2019   MCV 92.9 06/07/2019   PLT 305 06/07/2019   Lab Results  Component Value Date   NA 141 12/09/2019   K 3.4 (L) 12/09/2019   CO2 28 12/09/2019   GLUCOSE 89 12/09/2019   BUN 17 12/09/2019   CREATININE 1.14 (H) 12/09/2019   BILITOT 0.3  12/09/2019   ALKPHOS 92 09/07/2015   AST 11 12/09/2019   ALT 10 12/09/2019   PROT 6.5 12/09/2019  ALBUMIN 4.2 09/07/2015   CALCIUM 9.3 12/09/2019   Lab Results  Component Value Date   CHOL 147 01/23/2018   Lab Results  Component Value Date   HDL 45 (L) 01/23/2018   Lab Results  Component Value Date   LDLCALC 85 01/23/2018   Lab Results  Component Value Date   TRIG 82 01/23/2018   Lab Results  Component Value Date   CHOLHDL 3.3 01/23/2018   Lab Results  Component Value Date   HGBA1C 5.1 06/07/2019       Assessment & Plan:   Problem List Items Addressed This Visit      Cardiovascular and Mediastinum   Essential hypertension, benign - Primary    Uncontrolled today.  Her blood pressure was also a little elevated when I last saw her.  But previous blood pressures were fantastic in the 120s last summer.  May need to end up adjusting her HCTZ to 25 mg.        Other   Fibromyalgia    Did go ahead and refill her ibuprofen 800 mg today.  You to use heat and ice and gentle stretches and try to minimize use of ibuprofen if possible.  Definitely stop if any GI upset or irritation.      Relevant Medications   ibuprofen (ADVIL) 800 MG tablet    Other Visit Diagnoses    Left sided abdominal pain       Wheezing       Relevant Medications   albuterol (PROAIR HFA) 108 (90 Base) MCG/ACT inhaler      Left abdominal side pain-much better after taking a laxative.  We discussed maybe putting a teaspoon of MiraLAX in her coffee every morning she is also planning on trying to switch to decaf to help improve her blood pressure which is up a little bit today.   Requesting a refill on her albuterol inhaler.  Last time it was feel was probably little bit over a year ago she would like a new one just for cough and shortness of breath especially with allergy season.  Meds ordered this encounter  Medications  . ibuprofen (ADVIL) 800 MG tablet    Sig: Take 1 tablet (800 mg total)  by mouth every 8 (eight) hours as needed.    Dispense:  90 tablet    Refill:  1  . albuterol (PROAIR HFA) 108 (90 Base) MCG/ACT inhaler    Sig: TAKE 2 PUFFS BY MOUTH EVERY 6 HOURS AS NEEDED FOR WHEEZE    Dispense:  18 g    Refill:  1    DX Code Needed  THANK YOU.     Beatrice Lecher, MD

## 2020-02-17 NOTE — Assessment & Plan Note (Signed)
Uncontrolled today.  Her blood pressure was also a little elevated when I last saw her.  But previous blood pressures were fantastic in the 120s last summer.  May need to end up adjusting her HCTZ to 25 mg.

## 2020-02-17 NOTE — Assessment & Plan Note (Addendum)
Did go ahead and refill her ibuprofen 800 mg today.  You to use heat and ice and gentle stretches and try to minimize use of ibuprofen if possible.  Definitely stop if any GI upset or irritation.

## 2020-02-28 ENCOUNTER — Encounter: Payer: Medicaid Other | Admitting: Family Medicine

## 2020-03-02 ENCOUNTER — Ambulatory Visit (INDEPENDENT_AMBULATORY_CARE_PROVIDER_SITE_OTHER): Payer: Medicaid Other | Admitting: Family Medicine

## 2020-03-02 VITALS — BP 127/78 | HR 97 | Ht 64.0 in | Wt 160.0 lb

## 2020-03-02 DIAGNOSIS — I10 Essential (primary) hypertension: Secondary | ICD-10-CM | POA: Diagnosis not present

## 2020-03-02 NOTE — Progress Notes (Signed)
   Subjective:    Patient ID: Brianna Mendoza, female    DOB: 07-Mar-1981, 39 y.o.   MRN: [id number]  HPI Patient presents to office for blood pressure check. Blood pressure was elevated at last office visit at 138/98.   Review of Systems     Objective:   Physical Exam        Assessment & Plan:  Patient confirmed she is taking HCTZ as directed. Blood pressure today was 127/78. Patient denies any shortness of breath, headaches, feelings of tachycardia. Patient states she does not check her blood pressures at home, states she feels that blood pressure was elevated at last visit due to pain.

## 2020-03-02 NOTE — Progress Notes (Signed)
Agree with documentation as above.   Lyndsie Wallman, MD  

## 2020-03-28 ENCOUNTER — Other Ambulatory Visit: Payer: Self-pay | Admitting: Family Medicine

## 2020-03-28 DIAGNOSIS — I10 Essential (primary) hypertension: Secondary | ICD-10-CM

## 2020-04-12 ENCOUNTER — Other Ambulatory Visit: Payer: Self-pay | Admitting: Family Medicine

## 2020-04-12 DIAGNOSIS — I10 Essential (primary) hypertension: Secondary | ICD-10-CM

## 2020-04-29 ENCOUNTER — Ambulatory Visit: Payer: Medicaid Other

## 2020-04-30 ENCOUNTER — Encounter: Payer: Medicaid Other | Admitting: Family Medicine

## 2020-05-04 ENCOUNTER — Ambulatory Visit (INDEPENDENT_AMBULATORY_CARE_PROVIDER_SITE_OTHER): Payer: Medicaid Other | Admitting: Family Medicine

## 2020-05-04 ENCOUNTER — Other Ambulatory Visit: Payer: Self-pay

## 2020-05-04 VITALS — BP 147/100 | HR 74 | Wt 169.0 lb

## 2020-05-04 DIAGNOSIS — Z3042 Encounter for surveillance of injectable contraceptive: Secondary | ICD-10-CM

## 2020-05-04 MED ORDER — MEDROXYPROGESTERONE ACETATE 150 MG/ML IM SUSP
150.0000 mg | Freq: Once | INTRAMUSCULAR | Status: AC
  Administered 2020-05-04: 150 mg via INTRAMUSCULAR

## 2020-05-04 MED ORDER — LISINOPRIL-HYDROCHLOROTHIAZIDE 10-12.5 MG PO TABS
1.0000 | ORAL_TABLET | Freq: Every day | ORAL | 0 refills | Status: DC
Start: 2020-05-04 — End: 2020-05-15

## 2020-05-04 NOTE — Progress Notes (Signed)
Pt is here for a Depo Provera injection. Denies chest pain, shortness of breath, headaches, mood changes or problems with medication. Pt blood pressure was out of range at intitial check and recheck. Per pt, she states that her blood pressure has been elevated at last injection. Pt has an upcoming appointment with provider on 05/08/20. Last injection was 02/12/2020. Pt tolerated injection well on Left Deltoid (pt's preference). Pt is aware to schedule within 07/20/20 - 08/03/20 for next depo injection.

## 2020-05-04 NOTE — Progress Notes (Signed)
Pretension-uncontrolled.  Will change hydrochlorothiazide 12.5 mg to lisinopril/HCTZ.  Continue new medication and keep follow-up appointment.  Nani Gasser, MD

## 2020-05-05 NOTE — Progress Notes (Addendum)
Pt notified of new blood pressure medication sent to [facility] pharmacy. Confirmed to keep upcoming appt with provider on 05/08/20. Pt aware to contact the clinic if she has any reactions to new rx.

## 2020-05-15 ENCOUNTER — Other Ambulatory Visit: Payer: Self-pay

## 2020-05-15 ENCOUNTER — Ambulatory Visit (INDEPENDENT_AMBULATORY_CARE_PROVIDER_SITE_OTHER): Payer: 59 | Admitting: Family Medicine

## 2020-05-15 ENCOUNTER — Encounter: Payer: Self-pay | Admitting: Family Medicine

## 2020-05-15 VITALS — BP 143/94 | HR 97 | Ht 64.0 in | Wt 160.0 lb

## 2020-05-15 DIAGNOSIS — I10 Essential (primary) hypertension: Secondary | ICD-10-CM

## 2020-05-15 DIAGNOSIS — Z0000 Encounter for general adult medical examination without abnormal findings: Secondary | ICD-10-CM

## 2020-05-15 DIAGNOSIS — Z113 Encounter for screening for infections with a predominantly sexual mode of transmission: Secondary | ICD-10-CM

## 2020-05-15 DIAGNOSIS — R102 Pelvic and perineal pain: Secondary | ICD-10-CM

## 2020-05-15 DIAGNOSIS — Z124 Encounter for screening for malignant neoplasm of cervix: Secondary | ICD-10-CM

## 2020-05-15 DIAGNOSIS — Z1231 Encounter for screening mammogram for malignant neoplasm of breast: Secondary | ICD-10-CM

## 2020-05-15 DIAGNOSIS — R7989 Other specified abnormal findings of blood chemistry: Secondary | ICD-10-CM | POA: Diagnosis not present

## 2020-05-15 DIAGNOSIS — E538 Deficiency of other specified B group vitamins: Secondary | ICD-10-CM

## 2020-05-15 MED ORDER — CHLORTHALIDONE 25 MG PO TABS: 25.0000 mg | ORAL_TABLET | Freq: Every day | ORAL | 0 refills | Status: DC

## 2020-05-15 NOTE — Assessment & Plan Note (Signed)
Some pelvic cramping and on exam today she had a little bit of suprapubic most to the left tenderness.  Exam was otherwise normal.  Did perform STD testing since she is sexually active and will get pelvic ultrasound for further work-up.

## 2020-05-15 NOTE — Progress Notes (Signed)
Pt stated that she does NOT want to take Lisinopril. She would like to take something else to manage her BP.

## 2020-05-15 NOTE — Assessment & Plan Note (Signed)
She just reports she does not feel comfortable with taking lisinopril HCTZ she has been on it for years and did some reading and would just like to try something different so we discussed options including an ARB or maybe even chlorthalidone.  She would like to try chlorthalidone.  We will have her follow back up in 1 month for blood pressure check.

## 2020-05-15 NOTE — Progress Notes (Signed)
Subjective:     Brianna Mendoza is a 39 y.o. female and is here for a comprehensive physical exam. The patient reports problems - Overall she is doing well she actually has a new job in [place] and likes it.  She has to go in very early though but gets off by 11.   She has not had her Covid vaccine yet.  But she is really planning on making some changes in getting more healthy.  She is planning on joining the [facility] and starting to exercise again.  She has been a little bit saddened that her daughter was actually to be going into the [employer] in January.  She wants to be a Armed forces operational officer.  It is been her and her daughter for quite some time.  She does have 2 small puppies.  She also wanted to discuss some pelvic cramping.  She says normally when she gets her Depo shot she will have some cramping for about a day but starting back in January with her Depo shot she will cramp for several weeks afterwards so she just feels like something is changed something is different.  Her last Pap smear is up-to-date and was performed about 2 years ago she had normal cells and negative cotesting.  Also see note below in regards to hypertension.  Social History   Socioeconomic History  . Marital status: Single    Spouse name: Not on file  . Number of children: Not on file  . Years of education: Not on file  . Highest education level: Not on file  Occupational History  . Not on file  Tobacco Use  . Smoking status: Current Every Day Smoker    Packs/day: 0.25    Years: 4.00    Pack years: 1.00    Types: Cigarettes  . Smokeless tobacco: Never Used  Substance and Sexual Activity  . Alcohol use: No    Alcohol/week: 0.0 standard drinks  . Drug use: No  . 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 Difficulty of Paying Living Expenses:   Food Insecurity:   . Worried About Programme researcher, broadcasting/film/video in the Last Year:   . Barista in the Last Year:   Transportation Needs:   . Freight forwarder (Medical):   Marland Kitchen Lack of Transportation (Non-Medical):   Physical Activity:   . Days of Exercise per Week:   . Minutes of Exercise per Session:   Stress:   . Feeling of Stress :   Social Connections:   . Frequency of Communication with Friends and Family:   . Frequency of Social Gatherings with Friends and Family:   . Attends Religious Services:   . Active Member of Clubs or Organizations:   . Attends Banker Meetings:   Marland Kitchen Marital Status:   Intimate Partner Violence:   . Fear of Current or Ex-Partner:   . Emotionally Abused:   Marland Kitchen Physically Abused:   . Sexually Abused:    Health Maintenance  Topic Date Due  . Hepatitis C Screening  Never done  . INFLUENZA VACCINE  05/10/2020  . COVID-19 Vaccine (  1) 10/09/2020 (Originally 04/25/1992)  . PAP SMEAR-Modifier  01/30/2023  . TETANUS/TDAP  02/07/2025  . HIV Screening  Completed    The following portions of the patient's history were reviewed and updated as appropriate: allergies, current medications, past family history, past medical history, past social history, past surgical history and problem list.  Review of Systems A comprehensive review of systems was negative.   Objective:    BP (!) 143/94   Pulse 97   Ht 5\' 4"  (1.626 m)   Wt 160 lb (72.6 kg)   SpO2 100%   BMI 27.46 kg/m  General appearance: alert, cooperative and appears stated age Head: Normocephalic, without obvious abnormality, atraumatic Eyes: conj clear, EOMI, PEERLA Ears: normal TM's and external ear canals both ears Nose: Nares normal. Septum midline. Mucosa normal. No drainage or sinus tenderness. Throat: lips,  mucosa, and tongue normal; teeth and gums normal Neck: no adenopathy, no carotid bruit, no JVD, supple, symmetrical, trachea midline and thyroid not enlarged, symmetric, no tenderness/mass/nodules Back: symmetric, no curvature. ROM normal. No CVA tenderness. Lungs: clear to auscultation bilaterally Breasts: normal appearance, no masses or tenderness Heart: regular rate and rhythm, S1, S2 normal, no murmur, click, rub or gallop Abdomen: soft, non-tender; bowel sounds normal; no masses,  no organomegaly Pelvic: cervix normal in appearance, external genitalia normal, no adnexal masses or tenderness, no cervical motion tenderness, rectovaginal septum normal, uterus normal size, shape, and consistency and vagina normal without discharge Extremities: extremities normal, atraumatic, no cyanosis or edema Pulses: 2+ and symmetric Skin: Skin color, texture, turgor normal. No rashes or lesions Lymph nodes: Cervical, supraclavicular, and axillary nodes normal. Neurologic: Alert and oriented X 3, normal strength and tone. Normal symmetric reflexes. Normal coordination and gait    Assessment:    Healthy female exam.      Plan:     See After Visit Summary for Counseling Recommendations   Keep up a regular exercise program and make sure you are eating a healthy diet Try to eat 4 servings of dairy a day, or if you are lactose intolerant take a calcium with vitamin D daily.  Your vaccines are up to date.  Due for mammogram.    Essential hypertension, benign She just reports she does not feel comfortable with taking lisinopril HCTZ she has been on it for years and did some reading and would just like to try something different so we discussed options including an ARB or maybe even chlorthalidone.  She would like to try chlorthalidone.  We will have her follow back up in 1 month for blood pressure check.  Pelvic cramping Some pelvic cramping and on exam today she had a little bit of suprapubic most to the  left tenderness.  Exam was otherwise normal.  Did perform STD testing since she is sexually active and will get pelvic ultrasound for further work-up.  Due to check Vit D and b12.  Previous levels were low.  She does take a  D supplement.

## 2020-05-15 NOTE — Patient Instructions (Signed)
Health Maintenance, Female Adopting a healthy lifestyle and getting preventive care are important in promoting health and wellness. Ask your health care provider about:  The right schedule for you to have regular tests and exams.  Things you can do on your own to prevent diseases and keep yourself healthy. What should I know about diet, weight, and exercise? Eat a healthy diet   Eat a diet that includes plenty of vegetables, fruits, low-fat dairy products, and lean protein.  Do not eat a lot of foods that are high in solid fats, added sugars, or sodium. Maintain a healthy weight Body mass index (BMI) is used to identify weight problems. It estimates body fat based on height and weight. Your health care provider can help determine your BMI and help you achieve or maintain a healthy weight. Get regular exercise Get regular exercise. This is one of the most important things you can do for your health. Most adults should:  Exercise for at least 150 minutes each week. The exercise should increase your heart rate and make you sweat (moderate-intensity exercise).  Do strengthening exercises at least twice a week. This is in addition to the moderate-intensity exercise.  Spend less time sitting. Even light physical activity can be beneficial. Watch cholesterol and blood lipids Have your blood tested for lipids and cholesterol at 40 years of age, then have this test every 5 years. Have your cholesterol levels checked more often if:  Your lipid or cholesterol levels are high.  You are older than 40 years of age.  You are at high risk for heart disease. What should I know about cancer screening? Depending on your health history and family history, you may need to have cancer screening at various ages. This may include screening for:  Breast cancer.  Cervical cancer.  Colorectal cancer.  Skin cancer.  Lung cancer. What should I know about heart disease, diabetes, and high blood  pressure? Blood pressure and heart disease  High blood pressure causes heart disease and increases the risk of stroke. This is more likely to develop in people who have high blood pressure readings, are of African descent, or are overweight.  Have your blood pressure checked: ? Every 3-5 years if you are 18-39 years of age. ? Every year if you are 40 years old or older. Diabetes Have regular diabetes screenings. This checks your fasting blood sugar level. Have the screening done:  Once every three years after age 40 if you are at a normal weight and have a low risk for diabetes.  More often and at a younger age if you are overweight or have a high risk for diabetes. What should I know about preventing infection? Hepatitis B If you have a higher risk for hepatitis B, you should be screened for this virus. Talk with your health care provider to find out if you are at risk for hepatitis B infection. Hepatitis C Testing is recommended for:  Everyone born from 1945 through 1965.  Anyone with known risk factors for hepatitis C. Sexually transmitted infections (STIs)  Get screened for STIs, including gonorrhea and chlamydia, if: ? You are sexually active and are younger than 40 years of age. ? You are older than 40 years of age and your health care provider tells you that you are at risk for this type of infection. ? Your sexual activity has changed since you were last screened, and you are at increased risk for chlamydia or gonorrhea. Ask your health care provider if   you are at risk.  Ask your health care provider about whether you are at high risk for HIV. Your health care provider may recommend a prescription medicine to help prevent HIV infection. If you choose to take medicine to prevent HIV, you should first get tested for HIV. You should then be tested every 3 months for as long as you are taking the medicine. Pregnancy  If you are about to stop having your period (premenopausal) and  you may become pregnant, seek counseling before you get pregnant.  Take 400 to 800 micrograms (mcg) of folic acid every day if you become pregnant.  Ask for birth control (contraception) if you want to prevent pregnancy. Osteoporosis and menopause Osteoporosis is a disease in which the bones lose minerals and strength with aging. This can result in bone fractures. If you are 65 years old or older, or if you are at risk for osteoporosis and fractures, ask your health care provider if you should:  Be screened for bone loss.  Take a calcium or vitamin D supplement to lower your risk of fractures.  Be given hormone replacement therapy (HRT) to treat symptoms of menopause. Follow these instructions at home: Lifestyle  Do not use any products that contain nicotine or tobacco, such as cigarettes, e-cigarettes, and chewing tobacco. If you need help quitting, ask your health care provider.  Do not use street drugs.  Do not share needles.  Ask your health care provider for help if you need support or information about quitting drugs. Alcohol use  Do not drink alcohol if: ? Your health care provider tells you not to drink. ? You are pregnant, may be pregnant, or are planning to become pregnant.  If you drink alcohol: ? Limit how much you use to 0-1 drink a day. ? Limit intake if you are breastfeeding.  Be aware of how much alcohol is in your drink. In the [place] one drink equals one 12 oz bottle of beer (355 mL), one 5 oz glass of wine (148 mL), or one 1 oz glass of hard liquor (44 mL). General instructions  Schedule regular health, dental, and eye exams.  Stay current with your vaccines.  Tell your health care provider if: ? You often feel depressed. ? You have ever been abused or do not feel safe at home. Summary  Adopting a healthy lifestyle and getting preventive care are important in promoting health and wellness.  Follow your health care provider's instructions about healthy  diet, exercising, and getting tested or screened for diseases.  Follow your health care provider's instructions on monitoring your cholesterol and blood pressure. This information is not intended to replace advice given to you by your health care provider. Make sure you discuss any questions you have with your health care provider. Document Revised: 09/19/2018 Document Reviewed: 09/19/2018 Elsevier Patient Education  2020 [employer]  

## 2020-05-16 LAB — C. TRACHOMATIS/N. GONORRHOEAE RNA
C. trachomatis RNA, TMA: NOT DETECTED
N. gonorrhoeae RNA, TMA: NOT DETECTED

## 2020-05-25 ENCOUNTER — Other Ambulatory Visit: Payer: Self-pay

## 2020-05-25 ENCOUNTER — Ambulatory Visit (INDEPENDENT_AMBULATORY_CARE_PROVIDER_SITE_OTHER): Payer: 59 | Admitting: Family Medicine

## 2020-05-25 ENCOUNTER — Encounter: Payer: Self-pay | Admitting: Family Medicine

## 2020-05-25 VITALS — BP 113/78 | HR 92 | Ht 64.0 in | Wt 157.0 lb

## 2020-05-25 DIAGNOSIS — M25562 Pain in left knee: Secondary | ICD-10-CM | POA: Diagnosis not present

## 2020-05-25 DIAGNOSIS — M25511 Pain in right shoulder: Secondary | ICD-10-CM | POA: Diagnosis not present

## 2020-05-25 MED ORDER — ORPHENADRINE CITRATE ER 100 MG PO TB12: 100.0000 mg | ORAL_TABLET | Freq: Two times a day (BID) | ORAL | 1 refills | Status: DC

## 2020-05-25 MED ORDER — MELOXICAM 15 MG PO TABS: 15.0000 mg | ORAL_TABLET | Freq: Every day | ORAL | 0 refills | Status: DC
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
## 2020-05-29 ENCOUNTER — Ambulatory Visit: Payer: Medicaid Other | Admitting: Rehabilitative and Restorative Service Providers"

## 2020-06-09 ENCOUNTER — Ambulatory Visit (INDEPENDENT_AMBULATORY_CARE_PROVIDER_SITE_OTHER): Payer: 59 | Admitting: Family Medicine

## 2020-06-09 ENCOUNTER — Encounter: Payer: Self-pay | Admitting: Family Medicine

## 2020-06-09 VITALS — BP 120/71 | HR 101 | Ht 64.0 in | Wt 160.0 lb

## 2020-06-09 DIAGNOSIS — I10 Essential (primary) hypertension: Secondary | ICD-10-CM

## 2020-06-09 DIAGNOSIS — M25511 Pain in right shoulder: Secondary | ICD-10-CM | POA: Insufficient documentation

## 2020-06-09 DIAGNOSIS — M797 Fibromyalgia: Secondary | ICD-10-CM | POA: Diagnosis not present

## 2020-06-09 MED ORDER — PREDNISONE 20 MG PO TABS
40.0000 mg | ORAL_TABLET | Freq: Every day | ORAL | 0 refills | Status: DC
Start: 2020-06-09 — End: 2020-06-26

## 2020-06-09 NOTE — Assessment & Plan Note (Signed)
Well controlled. Continue current regimen. Follow up in  6 mo  

## 2020-06-09 NOTE — Assessment & Plan Note (Signed)
Fortunately I do feel like this is starting to flare some of her fibromyalgia so really want a work hard to try to get her pain under control and she will have to be careful with a physical therapist and letting them know so that they do not push her too hard and then it causes increased fatigue and muscle soreness.
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
## 2020-06-09 NOTE — Assessment & Plan Note (Signed)
There is some range of motion exercises to do working on shrugging the shoulder forward and backward and then also leaning forward and doing cervical range of motion without weight on the arm.  Because she has had an increase in pain and feels like she is actually gradually getting worse we will do a trial of prednisone.  Continue anti-inflammatory after the prednisone but she cannot mix them okay to use Tylenol.  She also has the appointment PT coming up on Tuesday so continue to ice and or use heat whichever feels better.

## 2020-06-16 ENCOUNTER — Other Ambulatory Visit: Payer: Self-pay | Admitting: Family Medicine

## 2020-06-16 DIAGNOSIS — M25511 Pain in right shoulder: Secondary | ICD-10-CM

## 2020-06-18 ENCOUNTER — Ambulatory Visit: Payer: Medicaid Other | Admitting: Family Medicine

## 2020-06-25 ENCOUNTER — Ambulatory Visit: Payer: Medicaid Other | Admitting: Family Medicine

## 2020-06-26 ENCOUNTER — Other Ambulatory Visit: Payer: Self-pay

## 2020-06-26 ENCOUNTER — Encounter: Payer: Self-pay | Admitting: Family Medicine

## 2020-06-26 ENCOUNTER — Ambulatory Visit (INDEPENDENT_AMBULATORY_CARE_PROVIDER_SITE_OTHER): Payer: 59 | Admitting: Family Medicine

## 2020-06-26 VITALS — BP 111/70 | HR 118 | Ht 64.0 in | Wt 156.0 lb

## 2020-06-26 DIAGNOSIS — G4700 Insomnia, unspecified: Secondary | ICD-10-CM | POA: Insufficient documentation

## 2020-06-26 DIAGNOSIS — M5030 Other cervical disc degeneration, unspecified cervical region: Secondary | ICD-10-CM

## 2020-06-26 DIAGNOSIS — M25511 Pain in right shoulder: Secondary | ICD-10-CM | POA: Diagnosis not present

## 2020-06-26 MED ORDER — MELOXICAM 15 MG PO TABS: 15.0000 mg | ORAL_TABLET | Freq: Every day | ORAL | 0 refills | Status: DC

## 2020-06-26 MED ORDER — CYCLOBENZAPRINE HCL 10 MG PO TABS: 5.0000 mg | ORAL_TABLET | Freq: Every evening | ORAL | 1 refills | Status: DC | PRN

## 2020-06-26 NOTE — Assessment & Plan Note (Signed)
Sleep difficulties with increased stress as well as increased pain in her shoulder and neck.  We will try Flexeril at bedtime and see if this is helpful.  Continue to work on good sleep hygiene.

## 2020-06-26 NOTE — Assessment & Plan Note (Signed)
Will refill the flexeril since does help and helps her rest at night.

## 2020-06-26 NOTE — Assessment & Plan Note (Addendum)
Has some decreased range of motion but she is making progress in PT and has been doing her home exercises dry needling is also really helping.    Continue meloxicam as needed as well as Flexeril at bedtime as needed.  Hopefully she will continue to improve through the rest of her physical therapy sessions.

## 2020-06-26 NOTE — Progress Notes (Signed)
Established Patient Office Visit  Subjective:  Patient ID: Brianna Mendoza, female    DOB: 1981/02/26  Age: 39 y.o. MRN: [id number]  CC:  Chief Complaint  Patient presents with  . Follow-up    HPI Brianna Mendoza presents for Right shoulder pain and trapezius strain.  She has been going to physical therapy and getting dry needling she says it is actually been really helpful in fact she feels better when she goes regularly she was not able to go to both sessions this week.  She did feel like the Flexeril was helping her sleep at night but ran out of that.  She feels like the Norflex that she taken during the day helps some at least initially but more recently feels like its not seeming to help quite as much.  She feels the meloxicam does help as well.  She really just wants to get ahead of the pain so it does not flare up her fibromyalgia. Reports the numbness and tingling in her right hand is better.   Reports that she is not been sleeping as well she just feels like there is a lot of stressors on her mind.  Usually it is the pain that initially wakes her up but then feels like her mind starts going and it is more difficult to fall back asleep. She has been up since 4 AM. She is using good sleep hygiene techniques.   Did let me know that she is planning on getting her mammogram scheduled later this fall and will be back in October for her Depo shot.  Past Medical History:  Diagnosis Date  . Hypertension     Past Surgical History:  Procedure Laterality Date  . LTCS      Family History  Problem Relation Age of Onset  . Hypertension Mother   . Hypertension Brother   . Hypertension Sister     Social History   Socioeconomic History  . Marital status: Single    Spouse name: Not on file  . Number of children: Not on file  . Years of education: Not on file  . Highest education level: Not on file  Occupational History  . Not on file  Tobacco Use  . Smoking status: Current Every  Day Smoker    Packs/day: 0.25    Years: 4.00    Pack years: 1.00    Types: Cigarettes  . Smokeless tobacco: Never Used  Substance and Sexual Activity  . Alcohol use: No    Alcohol/week: 0.0 standard drinks  . Drug use: No  . 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 Difficulty of Paying Living Expenses: Not on file  Food Insecurity:   . Worried About Programme researcher, broadcasting/film/video in the Last Year: Not on file  . Ran Out of Food in the Last Year: Not on file  Transportation Needs:   . Lack of Transportation (Medical): Not on file  . Lack of Transportation (Non-Medical): Not on file  Physical Activity:   . Days of Exercise per Week: Not on file  . Minutes of Exercise per Session: Not on file  Stress:   .  Feeling of Stress : Not on file  Social Connections:   . Frequency of Communication with Friends and Family: Not on file  . Frequency of Social Gatherings with Friends and Family: Not on file  . Attends Religious Services: Not on file  . Active Member of Clubs or Organizations: Not on file  . Attends Banker Meetings: Not on file  . Marital Status: Not on file  Intimate Partner Violence:   . Fear of Current or Ex-Partner: Not on file  . Emotionally Abused: Not on file  . Physically Abused: Not on file  . Sexually Abused: Not on file    Outpatient Medications Prior to Visit  Medication Sig Dispense Refill  . albuterol (PROAIR HFA) 108 (90 Base) MCG/ACT inhaler TAKE 2 PUFFS BY MOUTH EVERY 6 HOURS AS NEEDED FOR WHEEZE 18 g 1  . AMBULATORY NON FORMULARY MEDICATION Medication Name: Trace minerals    . Ascorbic Acid (VITAMIN C) 100 MG  tablet Take 100 mg by mouth daily.    . chlorthalidone (HYGROTON) 25 MG tablet Take 1 tablet (25 mg total) by mouth daily. 90 tablet 0  . cholecalciferol (VITAMIN D3) 25 MCG (1000 UNIT) tablet Take 1,000 Units by mouth daily.    . fluticasone (FLONASE) 50 MCG/ACT nasal spray Place 2 sprays into both nostrils daily. 16 g 6  . medroxyPROGESTERone (DEPO-PROVERA) 150 MG/ML injection Inject 150 mg into the muscle every 3 (three) months.      . orphenadrine (NORFLEX) 100 MG tablet Take 1 tablet (100 mg total) by mouth 2 (two) times daily. 40 tablet 1  . pantoprazole (PROTONIX) 40 MG tablet TAKE 1 TABLET BY MOUTH EVERY DAY 90 tablet 3  . vitamin E 1000 UNIT capsule Take 1,000 Units by mouth daily.    . meloxicam (MOBIC) 15 MG tablet TAKE 1 TABLET BY MOUTH EVERY DAY 30 tablet 0  . predniSONE (DELTASONE) 20 MG tablet Take 2 tablets (40 mg total) by mouth daily with breakfast. 10 tablet 0   No facility-administered medications prior to visit.    No Known Allergies  ROS Review of Systems    Objective:    Physical Exam Constitutional:      Appearance: She is well-developed.  HENT:     Head: Atraumatic.  Pulmonary:     Effort: Pulmonary effort is normal.  Musculoskeletal:     Comments: Neck with normal flexion, extension and rotation right and left.  Slightly dec side bending to the left compared to the right.  Right shoulder with dec ROM with extension to about 95 degrees,  able to reach behind her but only to belt line.   Skin:    General: Skin is warm and dry.  Neurological:     Mental Status: She is alert and oriented to person, place, and time.  Psychiatric:        Behavior: Behavior normal.     BP 111/70   Pulse (!) 118   Ht 5\' 4"  (1.626 m)   Wt 156 lb (70.8 kg)   SpO2 100%   BMI 26.78 kg/m  Wt Readings from Last 3 Encounters:  06/26/20 156 lb (70.8 kg)  06/09/20 160 lb (72.6 kg)  05/25/20 157 lb (71.2 kg)     Health Maintenance Due  Topic Date Due  . Hepatitis C  Screening  Never done  . INFLUENZA VACCINE  05/10/2020  . COVID-19 Vaccine (2 - Moderna 2-dose series) 06/13/2020    There are no preventive care reminders to  display for this patient.  Lab Results  Component Value Date   TSH 1.15 06/07/2019   Lab Results  Component Value Date   WBC 6.0 06/07/2019   HGB 13.8 06/07/2019   HCT 40.4 06/07/2019   MCV 92.9 06/07/2019   PLT 305 06/07/2019   Lab Results  Component Value Date   NA 141 12/09/2019   K 3.4 (L) 12/09/2019   CO2 28 12/09/2019   GLUCOSE 89 12/09/2019   BUN 17 12/09/2019   CREATININE 1.14 (H) 12/09/2019   BILITOT 0.3 12/09/2019   ALKPHOS 92 09/07/2015   AST 11 12/09/2019   ALT 10 12/09/2019   PROT 6.5 12/09/2019   ALBUMIN 4.2 09/07/2015   CALCIUM 9.3 12/09/2019   Lab Results  Component Value Date   CHOL 147 01/23/2018   Lab Results  Component Value Date   HDL 45 (L) 01/23/2018   Lab Results  Component Value Date   LDLCALC 85 01/23/2018   Lab Results  Component Value Date   TRIG 82 01/23/2018   Lab Results  Component Value Date   CHOLHDL 3.3 01/23/2018   Lab Results  Component Value Date   HGBA1C 5.1 06/07/2019      Assessment & Plan:   Problem List Items Addressed This Visit      Musculoskeletal and Integument   DDD (degenerative disc disease), cervical    Will refill the flexeril since does help and helps her rest at night.        Relevant Medications   cyclobenzaprine (FLEXERIL) 10 MG tablet   meloxicam (MOBIC) 15 MG tablet     Other   Insomnia    Sleep difficulties with increased stress as well as increased pain in her shoulder and neck.  We will try Flexeril at bedtime and see if this is helpful.  Continue to work on good sleep hygiene.      Acute pain of right shoulder - Primary    Has some decreased range of motion but she is making progress in PT and has been doing her home exercises dry needling is also really helping.    Continue meloxicam as needed as well as Flexeril at  bedtime as needed.  Hopefully she will continue to improve through the rest of her physical therapy sessions.      Relevant Medications   meloxicam (MOBIC) 15 MG tablet      Meds ordered this encounter  Medications  . cyclobenzaprine (FLEXERIL) 10 MG tablet    Sig: Take 0.5-1 tablets (5-10 mg total) by mouth at bedtime as needed for muscle spasms.    Dispense:  30 tablet    Refill:  1  . meloxicam (MOBIC) 15 MG tablet    Sig: Take 1 tablet (15 mg total) by mouth daily.    Dispense:  30 tablet    Refill:  0    Follow-up: No follow-ups on file.    Nani Gasser, MD

## 2020-07-20 ENCOUNTER — Encounter: Payer: Self-pay | Admitting: Family Medicine

## 2020-07-20 ENCOUNTER — Ambulatory Visit (INDEPENDENT_AMBULATORY_CARE_PROVIDER_SITE_OTHER): Payer: 59 | Admitting: Family Medicine

## 2020-07-20 VITALS — BP 106/69 | HR 95

## 2020-07-20 DIAGNOSIS — Z3042 Encounter for surveillance of injectable contraceptive: Secondary | ICD-10-CM | POA: Diagnosis not present

## 2020-07-20 MED ORDER — MEDROXYPROGESTERONE ACETATE 150 MG/ML IM SUSP
150.0000 mg | Freq: Once | INTRAMUSCULAR | Status: AC
  Administered 2020-07-20: 150 mg via INTRAMUSCULAR

## 2020-07-20 NOTE — Progress Notes (Signed)
Agree with documentation as above.   Ia Leeb, MD  

## 2020-07-20 NOTE — Progress Notes (Signed)
Patient is here for Depo Provera injection. Denies chest pain, shortness of breath, headaches, mood changes, or problems with medication.   Injection administered in the left deltoid per patient request. Patient tolerated injection well without complications. Patient advised to schedule next injection in 12 weeks (between 10/05/2020-10/19/2020).

## 2020-08-06 ENCOUNTER — Other Ambulatory Visit: Payer: Self-pay | Admitting: Family Medicine

## 2020-08-06 DIAGNOSIS — I10 Essential (primary) hypertension: Secondary | ICD-10-CM

## 2020-09-02 ENCOUNTER — Other Ambulatory Visit: Payer: Self-pay | Admitting: Family Medicine

## 2020-09-02 DIAGNOSIS — M5030 Other cervical disc degeneration, unspecified cervical region: Secondary | ICD-10-CM

## 2020-10-05 ENCOUNTER — Other Ambulatory Visit: Payer: Self-pay

## 2020-10-05 ENCOUNTER — Ambulatory Visit (INDEPENDENT_AMBULATORY_CARE_PROVIDER_SITE_OTHER): Payer: 59 | Admitting: Family Medicine

## 2020-10-05 VITALS — Wt 159.0 lb

## 2020-10-05 DIAGNOSIS — Z3042 Encounter for surveillance of injectable contraceptive: Secondary | ICD-10-CM

## 2020-10-05 MED ORDER — MEDROXYPROGESTERONE ACETATE 150 MG/ML IM SUSP
150.0000 mg | Freq: Once | INTRAMUSCULAR | Status: AC
  Administered 2020-10-05: 16:00:00 150 mg via INTRAMUSCULAR

## 2020-10-05 NOTE — Progress Notes (Signed)
Agree with documentation as above.   Damani Rando, MD  

## 2020-10-05 NOTE — Progress Notes (Signed)
Established Patient Office Visit  Subjective:  Patient ID: Brianna Mendoza, female    DOB: [birth year]  Age: 39 y.o. MRN: [id number]  CC:  Chief Complaint  Patient presents with  . Contraception    HPI Brianna Mendoza is here for a Depo Provera injection. Denies chest pain, shortness of breath, headaches, mood changes or problems with medication. It has been < 14 weeks since her last Depo Provera injection.   Past Medical History:  Diagnosis Date  . Hypertension     Past Surgical History:  Procedure Laterality Date  . LTCS      Family History  Problem Relation Age of Onset  . Hypertension Mother   . Hypertension Brother   . Hypertension Sister     Social History   Socioeconomic History  . Marital status: Single    Spouse name: Not on file  . Number of children: Not on file  . Years of education: Not on file  . Highest education level: Not on file  Occupational History  . Not on file  Tobacco Use  . Smoking status: Current Every Day Smoker    Packs/day: 0.25    Years: 4.00    Pack years: 1.00    Types: Cigarettes  . Smokeless tobacco: Never Used  Substance and Sexual Activity  . Alcohol use: No    Alcohol/week: 0.0 standard drinks  . Drug use: No  . 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 albuterol (PROAIR HFA) 108 (90 Base) MCG/ACT inhaler  TAKE 2 PUFFS BY MOUTH EVERY 6 HOURS AS NEEDED FOR WHEEZE 18 g 1  . AMBULATORY NON FORMULARY MEDICATION Medication Name: Trace minerals    . Ascorbic Acid (VITAMIN C) 100 MG tablet Take 100 mg by mouth daily.    . chlorthalidone (HYGROTON) 25 MG tablet TAKE 1 TABLET BY MOUTH EVERY DAY 90 tablet 0  . cholecalciferol (VITAMIN D3) 25 MCG (1000 UNIT) tablet Take 1,000 Units by mouth daily.    . cyclobenzaprine (FLEXERIL) 10 MG tablet TAKE 0.5-1 TABLETS (5-10 MG TOTAL) BY MOUTH AT BEDTIME AS NEEDED FOR MUSCLE SPASMS. 30 tablet 1  . fluticasone (FLONASE) 50 MCG/ACT nasal spray Place 2 sprays into both nostrils daily. 16 g 6  . medroxyPROGESTERone (DEPO-PROVERA) 150 MG/ML injection Inject 150 mg into the muscle every 3 (three) months.    . meloxicam (MOBIC) 15 MG tablet Take 1 tablet (15 mg total) by mouth daily. 30 tablet 0  . orphenadrine (NORFLEX) 100 MG tablet Take 1 tablet (100 mg total) by mouth 2 (two) times daily. 40 tablet 1  . pantoprazole (PROTONIX) 40 MG tablet TAKE 1 TABLET BY MOUTH EVERY DAY 90 tablet 3  . vitamin E 1000 UNIT capsule Take 1,000 Units by mouth daily.     No facility-administered medications prior to visit.    No  Known Allergies  ROS Review of Systems    Objective:    Physical Exam  Wt 159 lb (72.1 kg)   BMI 27.29 kg/m  Wt Readings from Last 3 Encounters:  10/05/20 159 lb (72.1 kg)  06/26/20 156 lb (70.8 kg)  06/09/20 160 lb (72.6 kg)     Health Maintenance Due  Topic Date Due  . Hepatitis C Screening  Never done  . INFLUENZA VACCINE  05/10/2020  . COVID-19 Vaccine (2 - Moderna 2-dose series) 06/13/2020    There are no preventive care reminders to display for this patient.  Lab Results  Component Value Date   TSH 1.15 06/07/2019   Lab Results  Component Value Date   WBC 6.0 06/07/2019   HGB 13.8 06/07/2019   HCT 40.4 06/07/2019   MCV 92.9 06/07/2019   PLT 305 06/07/2019   Lab Results  Component Value Date   NA 141 12/09/2019   K 3.4 (L)  12/09/2019   CO2 28 12/09/2019   GLUCOSE 89 12/09/2019   BUN 17 12/09/2019   CREATININE 1.14 (H) 12/09/2019   BILITOT 0.3 12/09/2019   ALKPHOS 92 09/07/2015   AST 11 12/09/2019   ALT 10 12/09/2019   PROT 6.5 12/09/2019   ALBUMIN 4.2 09/07/2015   CALCIUM 9.3 12/09/2019   Lab Results  Component Value Date   CHOL 147 01/23/2018   Lab Results  Component Value Date   HDL 45 (L) 01/23/2018   Lab Results  Component Value Date   LDLCALC 85 01/23/2018   Lab Results  Component Value Date   TRIG 82 01/23/2018   Lab Results  Component Value Date   CHOLHDL 3.3 01/23/2018   Lab Results  Component Value Date   HGBA1C 5.1 06/07/2019      Assessment & Plan:  Contraceptive - Patient tolerated injection well without complications. Patient advised to schedule next injection between March 14 th - March 28 th.   Problem List Items Addressed This Visit   None   Visit Diagnoses    Encounter for surveillance of injectable contraceptive    -  Primary   Relevant Medications   medroxyPROGESTERone (DEPO-PROVERA) injection 150 mg (Completed) (Start on 10/05/2020  3:45 PM)      Meds ordered this encounter  Medications  . medroxyPROGESTERone (DEPO-PROVERA) injection 150 mg    Follow-up: Return in about 12 weeks (around 12/28/2020) for Depo Provera injection. Earna Coder, Janalyn Harder, CMA

## 2020-11-03 ENCOUNTER — Other Ambulatory Visit: Payer: Self-pay | Admitting: Family Medicine

## 2020-11-03 DIAGNOSIS — I10 Essential (primary) hypertension: Secondary | ICD-10-CM

## 2020-11-16 ENCOUNTER — Other Ambulatory Visit: Payer: Self-pay | Admitting: Family Medicine

## 2020-11-16 DIAGNOSIS — K2100 Gastro-esophageal reflux disease with esophagitis, without bleeding: Secondary | ICD-10-CM

## 2020-11-24 ENCOUNTER — Telehealth: Payer: Self-pay | Admitting: Family Medicine

## 2020-11-24 DIAGNOSIS — M5030 Other cervical disc degeneration, unspecified cervical region: Secondary | ICD-10-CM

## 2020-11-24 DIAGNOSIS — M25511 Pain in right shoulder: Secondary | ICD-10-CM

## 2020-11-24 MED ORDER — CYCLOBENZAPRINE HCL 10 MG PO TABS: 5.0000 mg | ORAL_TABLET | Freq: Every evening | ORAL | 1 refills | Status: DC | PRN

## 2020-11-24 MED ORDER — MELOXICAM 15 MG PO TABS: 15.0000 mg | ORAL_TABLET | Freq: Every day | ORAL | 0 refills | Status: DC

## 2020-11-24 NOTE — Telephone Encounter (Signed)
Medications sent to pharmacy

## 2020-11-24 NOTE — Telephone Encounter (Signed)
Pt called and left a voicemail stating that she called on 11/20/20 requesting that her Flexeril & Meloxicam Rx. Be faxed to her pharmacy and that they can not take it electronically. Please let pt know pt know when this has been completed.

## 2020-12-21 ENCOUNTER — Other Ambulatory Visit: Payer: Self-pay

## 2020-12-21 ENCOUNTER — Ambulatory Visit (INDEPENDENT_AMBULATORY_CARE_PROVIDER_SITE_OTHER): Payer: 59 | Admitting: Family Medicine

## 2020-12-21 VITALS — BP 117/84 | HR 106

## 2020-12-21 DIAGNOSIS — Z3042 Encounter for surveillance of injectable contraceptive: Secondary | ICD-10-CM

## 2020-12-21 MED ORDER — MEDROXYPROGESTERONE ACETATE 150 MG/ML IM SUSP
150.0000 mg | Freq: Once | INTRAMUSCULAR | Status: AC
  Administered 2020-12-21: 150 mg via INTRAMUSCULAR

## 2020-12-21 NOTE — Progress Notes (Signed)
Agree with documentation as above.   Zyanya Glaza, MD  

## 2020-12-21 NOTE — Progress Notes (Signed)
Established Patient Office Visit  Subjective:  Patient ID: Brianna Mendoza, female    DOB: [birth year]  Age: 40 y.o. MRN: [id number]  CC:  Chief Complaint  Patient presents with  . Contraception    HPI Brianna Mendoza is here for a Depo Provera injection. Denies chest pain, shortness of breath, headaches, mood changes or problems with medication. It has been < 14 weeks since her last Depo Provera injection.   Blood pressure elevated on first check. She was very upset that she had to wait so long to be called back. I did apologize for my lateness.   Past Medical History:  Diagnosis Date  . Hypertension     Past Surgical History:  Procedure Laterality Date  . LTCS      Family History  Problem Relation Age of Onset  . Hypertension Mother   . Hypertension Brother   . Hypertension Sister     Social History   Socioeconomic History  . Marital status: Single    Spouse name: Not on file  . Number of children: Not on file  . Years of education: Not on file  . Highest education level: Not on file  Occupational History  . Not on file  Tobacco Use  . Smoking status: Current Every Day Smoker    Packs/day: 0.25    Years: 4.00    Pack years: 1.00    Types: Cigarettes  . Smokeless tobacco: Never Used  Substance and Sexual Activity  . Alcohol use: No    Alcohol/week: 0.0 standard drinks  . Drug use: No  . 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 albuterol (PROAIR HFA) 108 (90 Base) MCG/ACT inhaler TAKE 2 PUFFS BY MOUTH EVERY 6 HOURS AS NEEDED FOR WHEEZE 18 g 1  . AMBULATORY NON FORMULARY MEDICATION Medication Name: Trace minerals    . Ascorbic Acid (VITAMIN C) 100 MG tablet Take 100 mg by mouth daily.    . chlorthalidone (HYGROTON) 25 MG tablet TAKE 1 TABLET BY MOUTH EVERY DAY 90 tablet 0  . cholecalciferol (VITAMIN D3) 25 MCG (1000 UNIT) tablet Take 1,000 Units by mouth daily.    . cyclobenzaprine (FLEXERIL) 10 MG tablet Take 0.5-1 tablets (5-10 mg total) by mouth at bedtime as needed for muscle spasms. 30 tablet 1  . fluticasone (FLONASE) 50 MCG/ACT nasal spray Place 2 sprays into both nostrils daily. 16 g 6  . medroxyPROGESTERone (DEPO-PROVERA) 150 MG/ML injection Inject 150 mg into the muscle every 3 (three) months.    . meloxicam (MOBIC) 15 MG tablet Take 1 tablet (15 mg total) by mouth daily. 30 tablet 0  . orphenadrine (NORFLEX) 100 MG tablet Take 1 tablet (100 mg total) by mouth 2 (two) times daily. 40 tablet 1  . pantoprazole (PROTONIX) 40 MG tablet TAKE 1 TABLET BY MOUTH EVERY DAY 90  tablet 3  . vitamin E 1000 UNIT capsule Take 1,000 Units by mouth daily.     No facility-administered medications prior to visit.    No Known Allergies  ROS Review of Systems    Objective:    Physical Exam  BP 117/84   Pulse (!) 106   SpO2 100%  Wt Readings from Last 3 Encounters:  10/05/20 159 lb (72.1 kg)  06/26/20 156 lb (70.8 kg)  06/09/20 160 lb (72.6 kg)     Health Maintenance Due  Topic Date Due  . Hepatitis C Screening  Never done  . INFLUENZA VACCINE  05/10/2020  . COVID-19 Vaccine (2 - Moderna 3-dose series) 06/13/2020    There are no preventive care reminders to display for this patient.  Lab Results  Component Value Date   TSH 1.15 06/07/2019   Lab Results  Component Value Date   WBC 6.0 06/07/2019   HGB 13.8  06/07/2019   HCT 40.4 06/07/2019   MCV 92.9 06/07/2019   PLT 305 06/07/2019   Lab Results  Component Value Date   NA 141 12/09/2019   K 3.4 (L) 12/09/2019   CO2 28 12/09/2019   GLUCOSE 89 12/09/2019   BUN 17 12/09/2019   CREATININE 1.14 (H) 12/09/2019   BILITOT 0.3 12/09/2019   ALKPHOS 92 09/07/2015   AST 11 12/09/2019   ALT 10 12/09/2019   PROT 6.5 12/09/2019   ALBUMIN 4.2 09/07/2015   CALCIUM 9.3 12/09/2019   Lab Results  Component Value Date   CHOL 147 01/23/2018   Lab Results  Component Value Date   HDL 45 (L) 01/23/2018   Lab Results  Component Value Date   LDLCALC 85 01/23/2018   Lab Results  Component Value Date   TRIG 82 01/23/2018   Lab Results  Component Value Date   CHOLHDL 3.3 01/23/2018   Lab Results  Component Value Date   HGBA1C 5.1 06/07/2019      Assessment & Plan:  Contraception - Patient tolerated injection well without complications. Patient advised to schedule next injection between May 30 - June 13.   Blood pressure on second check was within normal range.   Problem List Items Addressed This Visit   None   Visit Diagnoses    Encounter for surveillance of injectable contraceptive    -  Primary   Relevant Medications   medroxyPROGESTERone (DEPO-PROVERA) injection 150 mg (Completed) (Start on 12/21/2020  3:45 PM)      Meds ordered this encounter  Medications  . medroxyPROGESTERone (DEPO-PROVERA) injection 150 mg    Follow-up: Return in about 12 weeks (around 03/15/2021) for Depo Provera injection. Earna Coder, Janalyn Harder, CMA

## 2021-01-23 ENCOUNTER — Other Ambulatory Visit: Payer: Self-pay | Admitting: Family Medicine

## 2021-01-23 DIAGNOSIS — M25511 Pain in right shoulder: Secondary | ICD-10-CM

## 2021-02-02 ENCOUNTER — Other Ambulatory Visit: Payer: Self-pay | Admitting: Family Medicine

## 2021-02-02 DIAGNOSIS — I10 Essential (primary) hypertension: Secondary | ICD-10-CM

## 2021-03-04 ENCOUNTER — Other Ambulatory Visit: Payer: Self-pay | Admitting: Family Medicine

## 2021-03-04 DIAGNOSIS — M25511 Pain in right shoulder: Secondary | ICD-10-CM

## 2021-03-10 ENCOUNTER — Other Ambulatory Visit: Payer: Self-pay

## 2021-03-10 ENCOUNTER — Ambulatory Visit (INDEPENDENT_AMBULATORY_CARE_PROVIDER_SITE_OTHER): Payer: 59 | Admitting: Family Medicine

## 2021-03-10 VITALS — BP 115/77 | HR 104

## 2021-03-10 DIAGNOSIS — M5030 Other cervical disc degeneration, unspecified cervical region: Secondary | ICD-10-CM

## 2021-03-10 DIAGNOSIS — Z3042 Encounter for surveillance of injectable contraceptive: Secondary | ICD-10-CM

## 2021-03-10 MED ORDER — CYCLOBENZAPRINE HCL 10 MG PO TABS: 5.0000 mg | ORAL_TABLET | Freq: Every evening | ORAL | 1 refills | Status: DC | PRN

## 2021-03-10 MED ORDER — MEDROXYPROGESTERONE ACETATE 150 MG/ML IM SUSP
150.0000 mg | Freq: Once | INTRAMUSCULAR | Status: AC
  Administered 2021-03-10: 150 mg via INTRAMUSCULAR

## 2021-03-10 NOTE — Progress Notes (Signed)
Established Patient Office Visit  Subjective:  Patient ID: Brianna Mendoza, female    DOB: [birth year]  Age: 40 y.o. MRN: [id number]  CC:  Chief Complaint  Patient presents with  . Contraception    HPI Brianna Mendoza is here for a Depo Provera injection. Denies chest pain, shortness of breath, headaches, mood changes or problems with medication. It has been < 14 weeks since her last Depo Provera injection.   Past Medical History:  Diagnosis Date  . Hypertension     Past Surgical History:  Procedure Laterality Date  . LTCS      Family History  Problem Relation Age of Onset  . Hypertension Mother   . Hypertension Brother   . Hypertension Sister     Social History   Socioeconomic History  . Marital status: Single    Spouse name: Not on file  . Number of children: Not on file  . Years of education: Not on file  . Highest education level: Not on file  Occupational History  . Not on file  Tobacco Use  . Smoking status: Current Every Day Smoker    Packs/day: 0.25    Years: 4.00    Pack years: 1.00    Types: Cigarettes  . Smokeless tobacco: Never Used  Substance and Sexual Activity  . Alcohol use: No    Alcohol/week: 0.0 standard drinks  . Drug use: No  . 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 albuterol (PROAIR HFA) 108 (90 Base) MCG/ACT inhaler  TAKE 2 PUFFS BY MOUTH EVERY 6 HOURS AS NEEDED FOR WHEEZE 18 g 1  . AMBULATORY NON FORMULARY MEDICATION Medication Name: Trace minerals    . Ascorbic Acid (VITAMIN C) 100 MG tablet Take 100 mg by mouth daily.    . chlorthalidone (HYGROTON) 25 MG tablet TAKE 1 TABLET BY MOUTH EVERY DAY 90 tablet 0  . cholecalciferol (VITAMIN D3) 25 MCG (1000 UNIT) tablet Take 1,000 Units by mouth daily.    . fluticasone (FLONASE) 50 MCG/ACT nasal spray Place 2 sprays into both nostrils daily. 16 g 6  . medroxyPROGESTERone (DEPO-PROVERA) 150 MG/ML injection Inject 150 mg into the muscle every 3 (three) months.    . meloxicam (MOBIC) 15 MG tablet TAKE 1 TABLET (15 MG TOTAL) BY MOUTH DAILY. APPT FOR FURTHER REFILLS 30 tablet 0  . orphenadrine (NORFLEX) 100 MG tablet Take 1 tablet (100 mg total) by mouth 2 (two) times daily. 40 tablet 1  . pantoprazole (PROTONIX) 40 MG tablet TAKE 1 TABLET BY MOUTH EVERY DAY 90 tablet 3  . vitamin E 1000 UNIT capsule Take 1,000 Units by mouth daily.    . cyclobenzaprine (FLEXERIL) 10 MG tablet Take 0.5-1 tablets (5-10 mg total) by mouth at bedtime as needed for muscle spasms. 30 tablet 1   No facility-administered medications prior to visit.  No Known Allergies  ROS Review of Systems    Objective:    Physical Exam  BP 115/77   Pulse (!) 104   SpO2 100%  Wt Readings from Last 3 Encounters:  10/05/20 159 lb (72.1 kg)  06/26/20 156 lb (70.8 kg)  06/09/20 160 lb (72.6 kg)     Health Maintenance Due  Topic Date Due  . Hepatitis C Screening  Never done  . COVID-19 Vaccine (2 - Moderna 3-dose series) 06/13/2020    There are no preventive care reminders to display for this patient.  Lab Results  Component Value Date   TSH 1.15 06/07/2019   Lab Results  Component Value Date   WBC 6.0 06/07/2019   HGB 13.8 06/07/2019   HCT 40.4 06/07/2019   MCV 92.9 06/07/2019   PLT 305 06/07/2019   Lab Results  Component Value Date   NA 141 12/09/2019   K 3.4 (L)  12/09/2019   CO2 28 12/09/2019   GLUCOSE 89 12/09/2019   BUN 17 12/09/2019   CREATININE 1.14 (H) 12/09/2019   BILITOT 0.3 12/09/2019   ALKPHOS 92 09/07/2015   AST 11 12/09/2019   ALT 10 12/09/2019   PROT 6.5 12/09/2019   ALBUMIN 4.2 09/07/2015   CALCIUM 9.3 12/09/2019   Lab Results  Component Value Date   CHOL 147 01/23/2018   Lab Results  Component Value Date   HDL 45 (L) 01/23/2018   Lab Results  Component Value Date   LDLCALC 85 01/23/2018   Lab Results  Component Value Date   TRIG 82 01/23/2018   Lab Results  Component Value Date   CHOLHDL 3.3 01/23/2018   Lab Results  Component Value Date   HGBA1C 5.1 06/07/2019      Assessment & Plan:  Contraceptive - Patient tolerated injection well without complications. Patient advised to schedule next injection between August 17 th to August 31 st.    Problem List Items Addressed This Visit    DDD (degenerative disc disease), cervical   Relevant Medications   cyclobenzaprine (FLEXERIL) 10 MG tablet    Other Visit Diagnoses    Encounter for surveillance of injectable contraceptive    -  Primary   Relevant Medications   medroxyPROGESTERone (DEPO-PROVERA) injection 150 mg (Completed) (Start on 03/10/2021  3:30 PM)      Meds ordered this encounter  Medications  . medroxyPROGESTERone (DEPO-PROVERA) injection 150 mg  . cyclobenzaprine (FLEXERIL) 10 MG tablet    Sig: Take 0.5-1 tablets (5-10 mg total) by mouth at bedtime as needed for muscle spasms.    Dispense:  30 tablet    Refill:  1    Follow-up: Return in about 12 weeks (around 06/02/2021) for Depo Provera.    Esmond Harps, CMA

## 2021-03-10 NOTE — Progress Notes (Signed)
Agree with documentation as above.   Brianna Cordy, MD  

## 2021-04-08 ENCOUNTER — Other Ambulatory Visit: Payer: Self-pay | Admitting: Family Medicine

## 2021-04-08 DIAGNOSIS — M25511 Pain in right shoulder: Secondary | ICD-10-CM

## 2021-05-01 ENCOUNTER — Other Ambulatory Visit: Payer: Self-pay | Admitting: Family Medicine

## 2021-05-01 DIAGNOSIS — M25511 Pain in right shoulder: Secondary | ICD-10-CM

## 2021-05-03 ENCOUNTER — Other Ambulatory Visit: Payer: Self-pay | Admitting: Family Medicine

## 2021-05-03 ENCOUNTER — Telehealth: Payer: Self-pay

## 2021-05-03 DIAGNOSIS — I10 Essential (primary) hypertension: Secondary | ICD-10-CM

## 2021-05-03 NOTE — Telephone Encounter (Signed)
Transition Care Management Unsuccessful Follow-up Telephone Call  Date of discharge and from where:  04/29/2021 from [facility]  Attempts:  1st Attempt  Reason for unsuccessful TCM follow-up call:  Unable to leave message    
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
## 2021-05-07 ENCOUNTER — Ambulatory Visit (INDEPENDENT_AMBULATORY_CARE_PROVIDER_SITE_OTHER): Payer: 59

## 2021-05-07 ENCOUNTER — Ambulatory Visit (INDEPENDENT_AMBULATORY_CARE_PROVIDER_SITE_OTHER): Payer: 59 | Admitting: Family Medicine

## 2021-05-07 ENCOUNTER — Other Ambulatory Visit: Payer: Self-pay

## 2021-05-07 ENCOUNTER — Encounter: Payer: Self-pay | Admitting: Family Medicine

## 2021-05-07 DIAGNOSIS — M25512 Pain in left shoulder: Secondary | ICD-10-CM

## 2021-05-07 DIAGNOSIS — R29898 Other symptoms and signs involving the musculoskeletal system: Secondary | ICD-10-CM

## 2021-05-07 DIAGNOSIS — M25531 Pain in right wrist: Secondary | ICD-10-CM

## 2021-05-07 DIAGNOSIS — M25511 Pain in right shoulder: Secondary | ICD-10-CM

## 2021-05-07 DIAGNOSIS — M25572 Pain in left ankle and joints of left foot: Secondary | ICD-10-CM | POA: Diagnosis not present

## 2021-05-07 MED ORDER — TRAMADOL HCL 50 MG PO TABS: 50.0000 mg | ORAL_TABLET | Freq: Two times a day (BID) | ORAL | 0 refills | Status: AC | PRN
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
## 2021-05-25 ENCOUNTER — Other Ambulatory Visit: Payer: Self-pay | Admitting: Family Medicine

## 2021-05-25 DIAGNOSIS — I10 Essential (primary) hypertension: Secondary | ICD-10-CM

## 2021-05-26 ENCOUNTER — Encounter: Payer: Self-pay | Admitting: Family Medicine

## 2021-05-26 ENCOUNTER — Ambulatory Visit (INDEPENDENT_AMBULATORY_CARE_PROVIDER_SITE_OTHER): Payer: 59 | Admitting: Family Medicine

## 2021-05-26 VITALS — BP 125/96 | HR 103 | Ht 64.0 in | Wt 171.1 lb

## 2021-05-26 DIAGNOSIS — Z0000 Encounter for general adult medical examination without abnormal findings: Secondary | ICD-10-CM

## 2021-05-26 DIAGNOSIS — Z113 Encounter for screening for infections with a predominantly sexual mode of transmission: Secondary | ICD-10-CM | POA: Diagnosis not present

## 2021-05-26 DIAGNOSIS — Z3042 Encounter for surveillance of injectable contraceptive: Secondary | ICD-10-CM | POA: Diagnosis not present

## 2021-05-26 DIAGNOSIS — R7989 Other specified abnormal findings of blood chemistry: Secondary | ICD-10-CM

## 2021-05-26 DIAGNOSIS — Z1231 Encounter for screening mammogram for malignant neoplasm of breast: Secondary | ICD-10-CM | POA: Diagnosis not present

## 2021-05-26 MED ORDER — MEDROXYPROGESTERONE ACETATE 150 MG/ML IM SUSP
150.0000 mg | Freq: Once | INTRAMUSCULAR | Status: AC
  Administered 2021-05-26: 150 mg via INTRAMUSCULAR

## 2021-05-26 NOTE — Progress Notes (Signed)
Subjective:     Brianna Mendoza is a 40 y.o. female and is here for a comprehensive physical exam. The patient reports no problems.   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, 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, 42 yo daughter, walks 30 mins daily  Other Topics Concern   Not on file  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, 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, 17 yo daughter, walks 30 mins daily   Social Determinants of Health   Financial Resource Strain: Not on file  Food Insecurity: Not on file  Transportation Needs: Not on file  Physical Activity: Not on file  Stress: Not on file  Social Connections: Not on file  Intimate Partner Violence: Not on file   Health Maintenance  Topic Date Due   Pneumococcal Vaccine 43-33 Years old (1 - PCV) Never done   Hepatitis C Screening  Never done   COVID-19 Vaccine (2 - Moderna series) 06/13/2020   INFLUENZA VACCINE  05/10/2021   PAP SMEAR-Modifier  01/30/2023   TETANUS/TDAP  02/07/2025   HIV Screening  Completed   HPV VACCINES  Aged Out    The following portions of the patient's history were reviewed and updated as appropriate: allergies, current medications, past family history, past medical history, past social history, past surgical history, and problem list.  Review of Systems Pertinent items are noted in HPI.   Objective:    BP (!) 125/96 (BP Location: Left Arm, Patient Position: Sitting, Cuff  Size: Normal)   Pulse (!) 103   Wt 171 lb 1.9 oz (77.6 kg)   SpO2 100%   BMI 29.37 kg/m  General appearance: alert, cooperative, and appears stated age Head: Normocephalic, without obvious abnormality, atraumatic Eyes:  conj clear, EOMI, PEERLA Ears: normal TM's and external ear canals both ears Nose: Nares normal. Septum midline. Mucosa normal. No drainage or sinus tenderness. Throat: lips, mucosa, and tongue normal; teeth and gums normal Neck: no adenopathy, no carotid bruit, no JVD, supple, symmetrical, trachea midline, and thyroid not enlarged, symmetric, no tenderness/mass/nodules Back: symmetric, no curvature. ROM normal. No CVA tenderness. Lungs: clear to auscultation bilaterally Breasts: normal appearance, no masses or tenderness Heart: regular rate and rhythm, S1, S2 normal, no murmur, click, rub or gallop Abdomen: soft, non-tender; bowel sounds normal; no masses,  no organomegaly Extremities: extremities normal, atraumatic, no cyanosis or edema Pulses: 2+ and symmetric Skin: Skin color, texture, turgor normal. No rashes or lesions Lymph nodes: Cervical, supraclavicular, and axillary nodes normal. Neurologic: Grossly normal    Assessment:    Healthy female exam.      Plan:     See After Visit Summary for Counseling Recommendations  Keep up a regular exercise program and make sure you are eating a healthy diet Try to eat 4 servings of dairy a day,  or if you are lactose intolerant take a calcium with vitamin D daily.  Your vaccines are up to date.  Pap smear is up-to-date.  Due for repeat in 2 years. Kercher to schedule her mammogram order placed.  -Given Depo-Provera today.

## 2021-05-26 NOTE — Addendum Note (Signed)
Addended by: Gaynelle Arabian on: 05/26/2021 12:09 PM   Modules accepted: Orders

## 2021-05-26 NOTE — Progress Notes (Signed)
Depo provera injection given in right deltoid . Patient tolerated injection well. She will follow up for next injection between November 2nd-  November 16th

## 2021-05-27 LAB — VITAMIN D 25 HYDROXY (VIT D DEFICIENCY, FRACTURES): Vit D, 25-Hydroxy: 32 ng/mL (ref 30–100)

## 2021-05-27 LAB — LIPID PANEL
Cholesterol: 169 mg/dL (ref <200)
HDL: 52 mg/dL (ref >=50)
LDL Cholesterol (Calc): 97 mg/dL (calc)
Non-HDL Cholesterol (Calc): 117 mg/dL (calc) (ref <130)
Total CHOL/HDL Ratio: 3.3 (calc) (ref <5.0)
Triglycerides: 100 mg/dL (ref <150)

## 2021-05-27 LAB — COMPLETE METABOLIC PANEL WITH GFR
AG Ratio: 1.4 (calc) (ref 1.0–2.5)
ALT: 13 U/L (ref 6–29)
AST: 13 U/L (ref 10–30)
Albumin: 4.2 g/dL (ref 3.6–5.1)
Alkaline phosphatase (APISO): 75 U/L (ref 31–125)
BUN/Creatinine Ratio: 18 (calc) (ref 6–22)
BUN: 20 mg/dL (ref 7–25)
CO2: 32 mmol/L (ref 20–32)
Calcium: 9.8 mg/dL (ref 8.6–10.2)
Chloride: 103 mmol/L (ref 98–110)
Creat: 1.12 mg/dL — ABNORMAL HIGH (ref 0.50–0.99)
Globulin: 2.9 g/dL (calc) (ref 1.9–3.7)
Glucose, Bld: 88 mg/dL (ref 65–99)
Potassium: 3.4 mmol/L — ABNORMAL LOW (ref 3.5–5.3)
Sodium: 142 mmol/L (ref 135–146)
Total Bilirubin: 0.6 mg/dL (ref 0.2–1.2)
Total Protein: 7.1 g/dL (ref 6.1–8.1)
eGFR: 63 mL/min/{1.73_m2} (ref >=60)

## 2021-05-28 ENCOUNTER — Encounter: Payer: Self-pay | Admitting: Family Medicine

## 2021-05-28 ENCOUNTER — Other Ambulatory Visit: Payer: Self-pay | Admitting: Family Medicine

## 2021-05-28 DIAGNOSIS — M25511 Pain in right shoulder: Secondary | ICD-10-CM

## 2021-05-28 LAB — C. TRACHOMATIS/N. GONORRHOEAE RNA
C. trachomatis RNA, TMA: NOT DETECTED
N. gonorrhoeae RNA, TMA: NOT DETECTED

## 2021-05-31 MED ORDER — POTASSIUM CHLORIDE CRYS ER 10 MEQ PO TBCR: 10.0000 meq | EXTENDED_RELEASE_TABLET | Freq: Once | ORAL | 5 refills | Status: DC

## 2021-05-31 NOTE — Telephone Encounter (Signed)
Meds ordered this encounter  Medications   potassium chloride (KLOR-CON) 10 MEQ tablet    Sig: Take 1 tablet (10 mEq total) by mouth once for 1 dose.    Dispense:  30 tablet    Refill:  5

## 2021-06-03 ENCOUNTER — Ambulatory Visit: Payer: 59 | Admitting: Family Medicine

## 2021-06-05 ENCOUNTER — Other Ambulatory Visit: Payer: Self-pay | Admitting: Family Medicine

## 2021-06-05 DIAGNOSIS — I10 Essential (primary) hypertension: Secondary | ICD-10-CM

## 2021-06-24 ENCOUNTER — Other Ambulatory Visit: Payer: Self-pay

## 2021-06-24 ENCOUNTER — Ambulatory Visit (INDEPENDENT_AMBULATORY_CARE_PROVIDER_SITE_OTHER): Payer: 59 | Admitting: Family Medicine

## 2021-06-24 ENCOUNTER — Encounter: Payer: Self-pay | Admitting: Family Medicine

## 2021-06-24 VITALS — BP 114/76 | HR 103 | Ht 64.0 in | Wt 171.0 lb

## 2021-06-24 DIAGNOSIS — K2100 Gastro-esophageal reflux disease with esophagitis, without bleeding: Secondary | ICD-10-CM | POA: Diagnosis not present

## 2021-06-24 DIAGNOSIS — M25572 Pain in left ankle and joints of left foot: Secondary | ICD-10-CM

## 2021-06-24 MED ORDER — PANTOPRAZOLE SODIUM 40 MG PO TBEC: 40.0000 mg | DELAYED_RELEASE_TABLET | Freq: Every day | ORAL | 3 refills | Status: DC

## 2021-06-24 NOTE — Progress Notes (Signed)
She is doing well. Moving around more not as sore she stated that PT really was helpful.
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
## 2021-07-04 ENCOUNTER — Encounter: Payer: Self-pay | Admitting: Family Medicine

## 2021-07-04 ENCOUNTER — Other Ambulatory Visit: Payer: Self-pay | Admitting: Family Medicine

## 2021-07-04 DIAGNOSIS — I10 Essential (primary) hypertension: Secondary | ICD-10-CM

## 2021-07-05 MED ORDER — CHLORTHALIDONE 25 MG PO TABS: 25.0000 mg | ORAL_TABLET | Freq: Every day | ORAL | 0 refills | Status: DC

## 2021-07-17 ENCOUNTER — Other Ambulatory Visit: Payer: Self-pay | Admitting: Family Medicine

## 2021-07-17 DIAGNOSIS — M5030 Other cervical disc degeneration, unspecified cervical region: Secondary | ICD-10-CM

## 2021-07-24 ENCOUNTER — Other Ambulatory Visit: Payer: Self-pay | Admitting: Family Medicine

## 2021-07-24 DIAGNOSIS — M25511 Pain in right shoulder: Secondary | ICD-10-CM

## 2021-08-11 ENCOUNTER — Other Ambulatory Visit: Payer: Self-pay

## 2021-08-11 ENCOUNTER — Ambulatory Visit (INDEPENDENT_AMBULATORY_CARE_PROVIDER_SITE_OTHER): Payer: 59 | Admitting: Family Medicine

## 2021-08-11 VITALS — BP 129/83 | HR 102 | Temp 98.7°F | Wt 170.0 lb

## 2021-08-11 DIAGNOSIS — Z3042 Encounter for surveillance of injectable contraceptive: Secondary | ICD-10-CM | POA: Diagnosis not present

## 2021-08-11 MED ORDER — MEDROXYPROGESTERONE ACETATE 150 MG/ML IM SUSP
150.0000 mg | Freq: Once | INTRAMUSCULAR | Status: AC
  Administered 2021-08-11: 150 mg via INTRAMUSCULAR

## 2021-08-11 NOTE — Progress Notes (Signed)
Patient is here for a Depo Provera injection. Denies chest pain, shortness of breath, headaches, mood changes or problems with medication. Injection administered in the Left Deltoid (patient's preference). Patient tolerated injection well without any complications. Patient advised to schedule next in 12 weeks.

## 2021-08-11 NOTE — Progress Notes (Signed)
Agree with documentation as above.   Avinash Maltos, MD  

## 2021-09-29 ENCOUNTER — Telehealth: Payer: Self-pay | Admitting: Family Medicine

## 2021-09-29 DIAGNOSIS — I10 Essential (primary) hypertension: Secondary | ICD-10-CM

## 2021-09-29 MED ORDER — CHLORTHALIDONE 25 MG PO TABS: 25.0000 mg | ORAL_TABLET | Freq: Every day | ORAL | 1 refills | Status: DC

## 2021-09-29 NOTE — Telephone Encounter (Signed)
Pt called stating pharmacy will not fill BP meds, due to her needing labs. She states that she had labs done in August. She believes that it is the chlorthalidone (25 mg tab)

## 2021-09-29 NOTE — Telephone Encounter (Signed)
See where that is printed on the prescription but I am not sure why that says that unless it was carried over from a prior prescription.  We can fill again for 90 days with 1 refill.  I do not actually see a refill request from the pharmacy so that is a little strange to.  But okay to refill.

## 2021-09-29 NOTE — Telephone Encounter (Signed)
Pt informed of RX.  T. Roderick Sweezy, CMA 

## 2021-10-28 ENCOUNTER — Telehealth: Payer: Self-pay

## 2021-10-28 ENCOUNTER — Other Ambulatory Visit: Payer: Self-pay

## 2021-10-28 ENCOUNTER — Ambulatory Visit (INDEPENDENT_AMBULATORY_CARE_PROVIDER_SITE_OTHER): Payer: 59 | Admitting: Family Medicine

## 2021-10-28 ENCOUNTER — Ambulatory Visit: Payer: 59

## 2021-10-28 VITALS — BP 112/73 | HR 103 | Wt 169.0 lb

## 2021-10-28 DIAGNOSIS — Z3042 Encounter for surveillance of injectable contraceptive: Secondary | ICD-10-CM | POA: Diagnosis not present

## 2021-10-28 MED ORDER — MEDROXYPROGESTERONE ACETATE 150 MG/ML IM SUSP
150.0000 mg | Freq: Once | INTRAMUSCULAR | Status: DC
Start: 2021-10-28 — End: 2021-10-28

## 2021-10-28 MED ORDER — MEDROXYPROGESTERONE ACETATE 150 MG/ML IM SUSP
150.0000 mg | Freq: Once | INTRAMUSCULAR | Status: AC
  Administered 2021-10-28: 150 mg via INTRAMUSCULAR

## 2021-10-28 NOTE — Progress Notes (Signed)
Agree with documentation as above.   Tanish Sinkler, MD  

## 2021-10-28 NOTE — Progress Notes (Signed)
Established Patient Office Visit  Subjective:  Patient ID: Brianna Mendoza, female    DOB: 11/02/1980  Age: 41 y.o. MRN: [id number]  CC:  Chief Complaint  Patient presents with   Contraception    HPI Brianna Mendoza presents for Depo Provera injection. She denies any chest pain, shortness of breath, headaches, mood changes or problems with medications.  Past Medical History:  Diagnosis Date   Hypertension     Past Surgical History:  Procedure Laterality Date   LTCS      Family History  Problem Relation Age of Onset   Hypertension Mother    Hypertension Brother    Hypertension Sister     Social History   Socioeconomic History   Marital status: Single    Spouse name: Not on file   Number of children: Not on file   Years of education: Not on file   Highest education level: Not on file  Occupational History   Not on file  Tobacco Use   Smoking status: Every Day    Packs/day: 0.25    Years: 4.00    Pack years: 1.00    Types: Cigarettes   Smokeless tobacco: Never  Substance and Sexual Activity   Alcohol use: No    Alcohol/week: 0.0 standard drinks   Drug use: No   Sexual activity: Not Currently    Birth control/protection: Injection    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, [place]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, 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, walks 30 mins daily  Other Topics Concern   Not on file  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 TAKE 2 PUFFS BY MOUTH EVERY 6 HOURS AS NEEDED FOR WHEEZE) 18 g 1   AMBULATORY NON FORMULARY MEDICATION Medication Name: Trace minerals     Ascorbic Acid (VITAMIN C) 100 MG tablet Take 100 mg by mouth daily.     chlorthalidone (HYGROTON) 25 MG tablet Take 1 tablet (25 mg total) by mouth daily. 90 tablet 1   cholecalciferol (VITAMIN D3) 25 MCG (1000 UNIT) tablet Take 1,000 Units by mouth daily.     cyclobenzaprine (FLEXERIL) 10 MG tablet TAKE 0.5-1 TABLETS (5-10 MG TOTAL) BY MOUTH AT BEDTIME AS NEEDED FOR MUSCLE SPASMS. 30 tablet 1   fluticasone (FLONASE) 50 MCG/ACT nasal spray Place 2 sprays into both nostrils daily. 16 g 6   medroxyPROGESTERone (DEPO-PROVERA) 150 MG/ML injection Inject 150 mg into the muscle every 3 (three) months.     meloxicam (MOBIC) 15 MG tablet TAKE 1 TABLET (15 MG TOTAL) BY MOUTH DAILY. 30 tablet 1   pantoprazole (PROTONIX) 40 MG tablet Take 1 tablet (40 mg total) by mouth daily. 90 tablet 3   vitamin E 1000 UNIT capsule Take 1,000 Units by mouth daily.     potassium chloride (KLOR-CON) 10 MEQ tablet Take 1 tablet (10 mEq total) by mouth once for 1  dose. 30 tablet 5   No facility-administered medications prior to visit.    No Known Allergies  ROS Review of Systems    Objective:    Physical Exam  BP 112/73    Pulse (!) 103    Wt 169 lb (76.7 kg)    SpO2 98%    BMI 29.01 kg/m  Wt Readings from Last 3 Encounters:  10/28/21 169 lb (76.7 kg)  08/11/21 170 lb 0.6 oz (77.1 kg)  06/24/21 171 lb (77.6 kg)     There are no preventive care reminders to display for this patient.  There are no preventive care reminders to display for this patient.  Lab Results  Component Value Date   TSH 1.15 06/07/2019   Lab Results  Component Value Date   WBC 6.0 06/07/2019   HGB 13.8 06/07/2019   HCT 40.4 06/07/2019   MCV 92.9 06/07/2019   PLT 305 06/07/2019   Lab Results  Component Value Date   NA 142 05/27/2021    K 3.4 (L) 05/27/2021   CO2 32 05/27/2021   GLUCOSE 88 05/27/2021   BUN 20 05/27/2021   CREATININE 1.12 (H) 05/27/2021   BILITOT 0.6 05/27/2021   ALKPHOS 92 09/07/2015   AST 13 05/27/2021   ALT 13 05/27/2021   PROT 7.1 05/27/2021   ALBUMIN 4.2 09/07/2015   CALCIUM 9.8 05/27/2021   EGFR 63 05/27/2021   Lab Results  Component Value Date   CHOL 169 05/27/2021   Lab Results  Component Value Date   HDL 52 05/27/2021   Lab Results  Component Value Date   LDLCALC 97 05/27/2021   Lab Results  Component Value Date   TRIG 100 05/27/2021   Lab Results  Component Value Date   CHOLHDL 3.3 05/27/2021   Lab Results  Component Value Date   HGBA1C 5.1 06/07/2019      Assessment & Plan:   Injection administered in the left deltoid per patient request. Patient tolerated injection well without complications. Patient advised to schedule next injection in 12 weeks.   Problem List Items Addressed This Visit   None Visit Diagnoses     On Depo-Provera for contraception    -  Primary   Relevant Medications   medroxyPROGESTERone (DEPO-PROVERA) injection 150 mg (Completed) (Start on 10/28/2021  3:15 PM)       Meds ordered this encounter  Medications   DISCONTD: medroxyPROGESTERone (DEPO-PROVERA) injection 150 mg   medroxyPROGESTERone (DEPO-PROVERA) injection 150 mg    Follow-up: Return in about 12 weeks (around 01/20/2022) for next injection.    Gust Brooms, CMA

## 2021-10-28 NOTE — Telephone Encounter (Signed)
Patient came in for nurse visit. She asked that we pass a message along.   Her daughter, Brianna Mendoza DOB 03/17/2003 has been in the army but will be coming home to visit.  She would like to see Dr. Linford Arnold during her time back. She is an established pt and will call to schedule appt when she is back.

## 2021-10-29 ENCOUNTER — Ambulatory Visit: Payer: 59

## 2021-11-21 ENCOUNTER — Other Ambulatory Visit: Payer: Self-pay | Admitting: Family Medicine

## 2021-11-21 DIAGNOSIS — M25511 Pain in right shoulder: Secondary | ICD-10-CM
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
## 2021-12-09 ENCOUNTER — Telehealth: Payer: Self-pay | Admitting: Family Medicine

## 2021-12-09 DIAGNOSIS — M25511 Pain in right shoulder: Secondary | ICD-10-CM

## 2021-12-09 MED ORDER — MELOXICAM 15 MG PO TABS: 15.0000 mg | ORAL_TABLET | Freq: Every day | ORAL | 1 refills | Status: DC

## 2021-12-09 NOTE — Telephone Encounter (Signed)
Pt called and requested a medication refill for meloxicam.   ?

## 2021-12-17 ENCOUNTER — Telehealth: Payer: Self-pay | Admitting: Family Medicine

## 2021-12-17 ENCOUNTER — Encounter: Payer: Self-pay | Admitting: Family Medicine

## 2021-12-17 DIAGNOSIS — R062 Wheezing: Secondary | ICD-10-CM

## 2021-12-17 MED ORDER — ALBUTEROL SULFATE HFA 108 (90 BASE) MCG/ACT IN AERS: INHALATION_SPRAY | RESPIRATORY_TRACT | 1 refills | Status: DC

## 2021-12-17 NOTE — Telephone Encounter (Signed)
Rx sent in by Celine Ahr on 12/17/21 ?

## 2021-12-17 NOTE — Telephone Encounter (Signed)
Patient called in to see if Dr. Madilyn Fireman could send a refill a her albuterol not able to find her the one from last you. Will an appt be needed. Please advise.  ?

## 2021-12-17 NOTE — Telephone Encounter (Signed)
Pt has not had a prescription for this medication since 2021.  Please review refill request and approve if appropriate.  Charyl Bigger, CMA ?

## 2022-01-13 ENCOUNTER — Ambulatory Visit (INDEPENDENT_AMBULATORY_CARE_PROVIDER_SITE_OTHER): Payer: 59 | Admitting: Physician Assistant

## 2022-01-13 VITALS — BP 117/78 | HR 107 | Resp 16 | Ht 64.0 in | Wt 166.0 lb

## 2022-01-13 DIAGNOSIS — Z3042 Encounter for surveillance of injectable contraceptive: Secondary | ICD-10-CM

## 2022-01-13 MED ORDER — MEDROXYPROGESTERONE ACETATE 150 MG/ML IM SUSP
150.0000 mg | Freq: Once | INTRAMUSCULAR | Status: AC
  Administered 2022-01-13: 150 mg via INTRAMUSCULAR

## 2022-01-13 NOTE — Progress Notes (Signed)
Agree with above plan. 

## 2022-01-13 NOTE — Progress Notes (Signed)
Patient in office for Depo Provera injection. Depo provera given in right deltoid. Patient tolerated injection well with no side effects. Patient was given dates to return 03-31-22 - 04/24/2022.  ?

## 2022-02-06 ENCOUNTER — Other Ambulatory Visit: Payer: Self-pay | Admitting: Family Medicine

## 2022-02-06 DIAGNOSIS — R062 Wheezing: Secondary | ICD-10-CM

## 2022-02-11 ENCOUNTER — Other Ambulatory Visit: Payer: Self-pay | Admitting: Family Medicine

## 2022-02-11 DIAGNOSIS — M25511 Pain in right shoulder: Secondary | ICD-10-CM

## 2022-03-17 ENCOUNTER — Other Ambulatory Visit: Payer: Self-pay | Admitting: Family Medicine

## 2022-03-17 DIAGNOSIS — M5030 Other cervical disc degeneration, unspecified cervical region: Secondary | ICD-10-CM

## 2022-03-31 ENCOUNTER — Ambulatory Visit (INDEPENDENT_AMBULATORY_CARE_PROVIDER_SITE_OTHER): Payer: Medicaid Other | Admitting: Family Medicine

## 2022-03-31 VITALS — BP 115/76 | HR 95 | Temp 98.5°F | Resp 20 | Ht 64.0 in | Wt 201.1 lb

## 2022-03-31 DIAGNOSIS — Z3042 Encounter for surveillance of injectable contraceptive: Secondary | ICD-10-CM | POA: Diagnosis not present

## 2022-03-31 MED ORDER — MEDROXYPROGESTERONE ACETATE 150 MG/ML IM SUSP
150.0000 mg | Freq: Once | INTRAMUSCULAR | Status: AC
  Administered 2022-03-31: 150 mg via INTRAMUSCULAR

## 2022-03-31 NOTE — Progress Notes (Signed)
Agree with documentation as above.   Robley Matassa, MD  

## 2022-04-04 ENCOUNTER — Other Ambulatory Visit: Payer: Self-pay | Admitting: Family Medicine

## 2022-04-04 DIAGNOSIS — I10 Essential (primary) hypertension: Secondary | ICD-10-CM

## 2022-04-04 NOTE — Telephone Encounter (Signed)
LVM letting patient know she is due for Lab work for further med refills, please make sure labs are in system for patient as well.A MUCK

## 2022-04-07 ENCOUNTER — Other Ambulatory Visit: Payer: Self-pay | Admitting: Family Medicine

## 2022-04-07 ENCOUNTER — Other Ambulatory Visit: Payer: Self-pay

## 2022-04-07 DIAGNOSIS — I10 Essential (primary) hypertension: Secondary | ICD-10-CM

## 2022-04-07 DIAGNOSIS — R062 Wheezing: Secondary | ICD-10-CM

## 2022-04-08 ENCOUNTER — Telehealth: Payer: Self-pay | Admitting: Family Medicine

## 2022-04-08 ENCOUNTER — Other Ambulatory Visit: Payer: Medicaid Other

## 2022-04-08 MED ORDER — CHLORTHALIDONE 25 MG PO TABS: 25.0000 mg | ORAL_TABLET | Freq: Every day | ORAL | 1 refills | Status: DC

## 2022-04-08 NOTE — Telephone Encounter (Signed)
LVM letting patient know information below. AMUCK

## 2022-04-08 NOTE — Telephone Encounter (Signed)
Refill sent.

## 2022-04-08 NOTE — Telephone Encounter (Signed)
Patient is in office today having Lab work done & she stated she is completely out of her BP medication and needs a refill sent to pharmacy listed below.  [id number] - [place], [place] - [address] AT [address] Phone:  [phone]  Fax:  [phone]

## 2022-04-09 LAB — COMPLETE METABOLIC PANEL WITH GFR
AG Ratio: 1.5 (calc) (ref 1.0–2.5)
ALT: 10 U/L (ref 6–29)
AST: 13 U/L (ref 10–30)
Albumin: 4.2 g/dL (ref 3.6–5.1)
Alkaline phosphatase (APISO): 72 U/L (ref 31–125)
BUN/Creatinine Ratio: 15 (calc) (ref 6–22)
BUN: 18 mg/dL (ref 7–25)
CO2: 29 mmol/L (ref 20–32)
Calcium: 9.6 mg/dL (ref 8.6–10.2)
Chloride: 103 mmol/L (ref 98–110)
Creat: 1.19 mg/dL — ABNORMAL HIGH (ref 0.50–0.99)
Globulin: 2.8 g/dL (calc) (ref 1.9–3.7)
Glucose, Bld: 85 mg/dL (ref 65–99)
Potassium: 3.5 mmol/L (ref 3.5–5.3)
Sodium: 140 mmol/L (ref 135–146)
Total Bilirubin: 0.4 mg/dL (ref 0.2–1.2)
Total Protein: 7 g/dL (ref 6.1–8.1)
eGFR: 59 mL/min/{1.73_m2} — ABNORMAL LOW (ref >=60)

## 2022-04-11 NOTE — Progress Notes (Signed)
Hi Brianna Mendoza, kidney function is stable at 1.1, liver enzymes look great.  Potassium is normal this time.  Overall labs look pretty good.

## 2022-04-12 ENCOUNTER — Other Ambulatory Visit: Payer: Self-pay | Admitting: Family Medicine

## 2022-04-12 DIAGNOSIS — M25511 Pain in right shoulder: Secondary | ICD-10-CM

## 2022-06-16 ENCOUNTER — Ambulatory Visit (INDEPENDENT_AMBULATORY_CARE_PROVIDER_SITE_OTHER): Payer: Medicaid Other | Admitting: Family Medicine

## 2022-06-16 ENCOUNTER — Ambulatory Visit: Payer: Medicaid Other

## 2022-06-16 VITALS — BP 121/85 | HR 100

## 2022-06-16 DIAGNOSIS — Z3042 Encounter for surveillance of injectable contraceptive: Secondary | ICD-10-CM | POA: Diagnosis not present

## 2022-06-16 MED ORDER — MEDROXYPROGESTERONE ACETATE 150 MG/ML IM SUSP
150.0000 mg | Freq: Once | INTRAMUSCULAR | Status: AC
  Administered 2022-06-16: 150 mg via INTRAMUSCULAR

## 2022-06-16 NOTE — Progress Notes (Signed)
   Established Patient Office Visit  Subjective   Patient ID: Saleha Kalp, female    DOB: [birth year]  Age: 41 y.o. MRN: [id number]  Chief Complaint  Patient presents with   Contraception    HPI  Camrie Curvin is here for a Depo Provera injection. Denies chest pain, shortness of breath, headaches, mood changes or problems with medication. It has been < 14 weeks since her last Depo Provera injection.   ROS    Objective:     BP 121/85   Pulse 100   SpO2 100%    Physical Exam   No results found for any visits on 06/16/22.    The 10-year ASCVD risk score (Arnett DK, et al., 2019) is: 2.5%    Assessment & Plan:  Contraception - Patient tolerated injection well without complications. Patient advised to schedule next injection between November 23 through December 7.  Problem List Items Addressed This Visit   None Visit Diagnoses     Encounter for Depo-Provera contraception    -  Primary   Relevant Medications   medroxyPROGESTERone (DEPO-PROVERA) injection 150 mg (Completed) (Start on 06/16/2022  2:00 PM)       Return in about 12 weeks (around 09/08/2022) for Depo provera injection. Earna Coder, Janalyn Harder, CMA

## 2022-06-16 NOTE — Progress Notes (Signed)
Agree with documentation as above.   Raneem Mendolia, MD  

## 2022-06-28 ENCOUNTER — Other Ambulatory Visit: Payer: Self-pay | Admitting: Family Medicine

## 2022-06-28 DIAGNOSIS — M5030 Other cervical disc degeneration, unspecified cervical region: Secondary | ICD-10-CM

## 2022-07-25 ENCOUNTER — Other Ambulatory Visit: Payer: Self-pay | Admitting: Family Medicine

## 2022-07-25 DIAGNOSIS — K2100 Gastro-esophageal reflux disease with esophagitis, without bleeding: Secondary | ICD-10-CM

## 2022-08-18 ENCOUNTER — Telehealth: Payer: Self-pay | Admitting: Family Medicine

## 2022-08-18 NOTE — Telephone Encounter (Signed)
Patient has appt for this month 11/22 nurse visit but having many issues with pre menopause  dizzy,headache, sweating and fatigue what can she do? I scheduled an appointment for 12/5/ 2023 but she needs something now.Marland Kitchen

## 2022-08-19 NOTE — Telephone Encounter (Signed)
I called patient and informed her of your message she stated she will try that method to see if it helps.

## 2022-08-19 NOTE — Telephone Encounter (Signed)
Agree needs appt.   She can try Estroven OTC for now. Cutting out caffine and sugars.

## 2022-08-23 ENCOUNTER — Other Ambulatory Visit: Payer: Self-pay | Admitting: Family Medicine

## 2022-08-23 DIAGNOSIS — M25511 Pain in right shoulder: Secondary | ICD-10-CM

## 2022-08-30 ENCOUNTER — Telehealth: Payer: Self-pay

## 2022-08-30 NOTE — Telephone Encounter (Signed)
Patient is scheduled for a nurse visit tomorrow 08/31/22 she is one day too early for  the depoprovera injection. Will need to reschedule for a nurse visit for next week. I attempted to call patient but had to leave a voice mail requesting a return call . If she calls back please reschedule for nurse visit for next week. Thank you so much!!

## 2022-08-31 ENCOUNTER — Ambulatory Visit (INDEPENDENT_AMBULATORY_CARE_PROVIDER_SITE_OTHER): Payer: BC Managed Care – PPO | Admitting: Family Medicine

## 2022-08-31 VITALS — BP 118/78 | HR 105 | Temp 98.5°F | Wt 161.0 lb

## 2022-08-31 DIAGNOSIS — Z3042 Encounter for surveillance of injectable contraceptive: Secondary | ICD-10-CM | POA: Diagnosis not present

## 2022-08-31 MED ORDER — MEDROXYPROGESTERONE ACETATE 150 MG/ML IM SUSP
150.0000 mg | Freq: Once | INTRAMUSCULAR | Status: AC
  Administered 2022-08-31: 150 mg via INTRAMUSCULAR

## 2022-08-31 NOTE — Telephone Encounter (Signed)
Patient stated that she will be here today and that Selena Batten spoke to Dr. Linford Arnold  and she has approved. tvt

## 2022-08-31 NOTE — Progress Notes (Signed)
Pt is here for a Depo Provera injection. Denies chest pain, shortness of breath, headaches, mood changes or problems with medication. She is within injection window. Tolerated injection today in LD well, no immediate complication. Advised to scheduled next depo appointment. Informed to RTC between 2/7-2/21/2024 for next injection. No further questions or concerns.

## 2022-09-05 NOTE — Progress Notes (Signed)
Agree with documentation as above.   Dakota Stangl, MD  

## 2022-09-13 ENCOUNTER — Encounter: Payer: Self-pay | Admitting: Family Medicine

## 2022-09-13 ENCOUNTER — Ambulatory Visit: Payer: BC Managed Care – PPO | Admitting: Family Medicine

## 2022-09-13 VITALS — BP 105/70 | HR 107 | Ht 64.0 in | Wt 160.4 lb

## 2022-09-13 DIAGNOSIS — M797 Fibromyalgia: Secondary | ICD-10-CM | POA: Diagnosis not present

## 2022-09-13 DIAGNOSIS — R102 Pelvic and perineal pain: Secondary | ICD-10-CM

## 2022-09-13 DIAGNOSIS — R61 Generalized hyperhidrosis: Secondary | ICD-10-CM

## 2022-09-13 DIAGNOSIS — I10 Essential (primary) hypertension: Secondary | ICD-10-CM | POA: Diagnosis not present

## 2022-09-13 DIAGNOSIS — R062 Wheezing: Secondary | ICD-10-CM

## 2022-09-13 MED ORDER — ALBUTEROL SULFATE HFA 108 (90 BASE) MCG/ACT IN AERS: INHALATION_SPRAY | RESPIRATORY_TRACT | 1 refills | Status: DC

## 2022-09-13 NOTE — Assessment & Plan Note (Signed)
Improved after getting her last Depo shot.  She is just going to see how she feels over the next couple months she is not interested in an IUD at this point and would prefer to continue with the Depo shot.

## 2022-09-13 NOTE — Assessment & Plan Note (Signed)
Pressure looks great today in fact it is a little bit low so we discussed, keeping an eye on it if it stays low we may need to taper off the chlorthalidone.  The last time she tried to stop it she started getting ankle swelling so we would likely have to taper the medication to allow her kidneys to recover and address the lower extremity swelling.

## 2022-09-13 NOTE — Progress Notes (Signed)
Established Patient Office Visit  Subjective   Patient ID: Brianna Mendoza, female    DOB: 07/05/1981  Age: 41 y.o. MRN: [id number]  Chief Complaint  Patient presents with   Menopause    HPI  She is here today because she was just not feeling well several weeks ago she was experiencing some dizziness and feeling just confused and having more brain fog it was hard to focus.  Her sleep really got worse.  She was waking up frequently at night.  She decided to start taking magnesium 500 mg twice a day she only did that for about a week and then went down to once a day.  She also started taking some Virl Axe twice a day and some black cohosh she felt like her symptoms started to gradually improve.  She also came in a couple of weeks ago for her Depo-Provera shot and says that the pelvic cramping she was having actually seems to have stopped and resolved since then.  Given she has been gradually feeling better but just wanted to come in and discuss her symptoms.    ROS    Objective:     BP 105/70 (BP Location: Left Arm, Patient Position: Sitting, Cuff Size: Normal)   Pulse (!) 107   Ht 5\' 4"  (1.626 m)   Wt 160 lb 6.4 oz (72.8 kg)   SpO2 100%   BMI 27.53 kg/m    Physical Exam Vitals and nursing note reviewed.  Constitutional:      Appearance: She is well-developed.  HENT:     Head: Normocephalic and atraumatic.  Cardiovascular:     Rate and Rhythm: Normal rate and regular rhythm.     Heart sounds: Normal heart sounds.  Pulmonary:     Effort: Pulmonary effort is normal.     Breath sounds: Normal breath sounds.  Skin:    General: Skin is warm and dry.  Neurological:     Mental Status: She is alert and oriented to person, place, and time.  Psychiatric:        Behavior: Behavior normal.      No results found for any visits on 09/13/22.    The 10-year ASCVD risk score (Arnett DK, et al., 2019) is: 1.2%    Assessment & Plan:   Problem List Items Addressed This  Visit       Cardiovascular and Mediastinum   Essential hypertension, benign    Pressure looks great today in fact it is a little bit low so we discussed, keeping an eye on it if it stays low we may need to taper off the chlorthalidone.  The last time she tried to stop it she started getting ankle swelling so we would likely have to taper the medication to allow her kidneys to recover and address the lower extremity swelling.        Other   Pelvic cramping - Primary    Improved after getting her last Depo shot.  She is just going to see how she feels over the next couple months she is not interested in an IUD at this point and would prefer to continue with the Depo shot.      Fibromyalgia    Starting in late December she is can to start getting regular massages and facials to really do something for herself and reduce her stress and help with muscle pain and aches.  She still taking Flexeril but only taking a half of a tab.  Other Visit Diagnoses     Night sweats       Wheezing       Relevant Medications   albuterol (VENTOLIN HFA) 108 (90 Base) MCG/ACT inhaler      Night sweats/dizziness-unclear etiology it sounds like it could be somewhat perimenopausal and was also occurred around the time that her Depo was wearing off so we will just have to monitor for symptoms over the next couple of months we can always do some hormone level evaluation via lab if needed.  No follow-ups on file.    Nani Gasser, MD

## 2022-09-13 NOTE — Assessment & Plan Note (Signed)
Starting in late December she is can to start getting regular massages and facials to really do something for herself and reduce her stress and help with muscle pain and aches.  She still taking Flexeril but only taking a half of a tab.

## 2022-10-01 ENCOUNTER — Other Ambulatory Visit: Payer: Self-pay | Admitting: Family Medicine

## 2022-10-01 DIAGNOSIS — I10 Essential (primary) hypertension: Secondary | ICD-10-CM

## 2022-10-23 ENCOUNTER — Other Ambulatory Visit: Payer: Self-pay | Admitting: Family Medicine

## 2022-10-23 DIAGNOSIS — M5030 Other cervical disc degeneration, unspecified cervical region: Secondary | ICD-10-CM

## 2022-11-16 ENCOUNTER — Ambulatory Visit (INDEPENDENT_AMBULATORY_CARE_PROVIDER_SITE_OTHER): Payer: BC Managed Care – PPO | Admitting: Family Medicine

## 2022-11-16 VITALS — BP 104/70 | HR 97 | Temp 98.7°F | Resp 20 | Ht 64.0 in | Wt 160.1 lb

## 2022-11-16 DIAGNOSIS — Z3042 Encounter for surveillance of injectable contraceptive: Secondary | ICD-10-CM

## 2022-11-16 MED ORDER — MEDROXYPROGESTERONE ACETATE 150 MG/ML IM SUSP
150.0000 mg | Freq: Once | INTRAMUSCULAR | Status: AC
  Administered 2022-11-16: 150 mg via INTRAMUSCULAR

## 2022-11-16 NOTE — Progress Notes (Unsigned)
   Subjective:    Patient ID: Brianna Mendoza, female    DOB: 1981-08-09, 42 y.o.   MRN: [id number]  HPI  Patient here for a Depo Provera injection. Denies chest pain, shortness of breath, headaches, mood changes, or problems with medication.  Review of Systems     Objective:   Physical Exam        Assessment & Plan:   Injection administered Left Deltoid. Patient tolerated injection well without complications. Patient advised to schedule next injection in 12 weeks.

## 2022-11-17 NOTE — Progress Notes (Signed)
Agree with documentation as above.   Onnie Alatorre, MD  

## 2023-01-30 ENCOUNTER — Other Ambulatory Visit: Payer: Self-pay | Admitting: Family Medicine

## 2023-01-30 DIAGNOSIS — M5030 Other cervical disc degeneration, unspecified cervical region: Secondary | ICD-10-CM

## 2023-02-02 ENCOUNTER — Telehealth: Payer: Self-pay | Admitting: Family Medicine

## 2023-02-02 ENCOUNTER — Ambulatory Visit (INDEPENDENT_AMBULATORY_CARE_PROVIDER_SITE_OTHER): Payer: BC Managed Care – PPO | Admitting: Family Medicine

## 2023-02-02 VITALS — BP 113/81 | HR 93 | Ht 64.0 in

## 2023-02-02 DIAGNOSIS — Z3042 Encounter for surveillance of injectable contraceptive: Secondary | ICD-10-CM | POA: Diagnosis not present

## 2023-02-02 MED ORDER — MEDROXYPROGESTERONE ACETATE 150 MG/ML IM SUSY
PREFILLED_SYRINGE | Freq: Once | INTRAMUSCULAR | Status: AC
  Administered 2023-02-02: 150 mg via INTRAMUSCULAR

## 2023-02-02 NOTE — Progress Notes (Signed)
Agree with documentation as above.   Koichi Platte, MD  

## 2023-02-02 NOTE — Telephone Encounter (Signed)
Please call patient and remind her that she is due for her Pap smear it has been 5 years we are happy to do it here or we can get her scheduled down the hall with her GYN or if she has another gynecologist that she is seen in the past that is fine as well.

## 2023-02-02 NOTE — Patient Instructions (Signed)
Return between Jul 11th and July  25th  for nurse visit for depoprovera injection.

## 2023-02-02 NOTE — Progress Notes (Signed)
   Established Patient Office Visit  Subjective   Patient ID: Brianna Mendoza, female    DOB: 08-28-1981  Age: 42 y.o. MRN: [id number]  Chief Complaint  Patient presents with   depoprovera    Depoprovera injection nurse visit.     HPI  Depoprovera injection nurse visit. Patient states she is not cramping today  but she does have some low back discomfort that she feels will improve after the depoprovera injection.   ROS    Objective:     BP 113/81   Pulse 93   Ht  (1.626 m)   SpO2 100%   BMI 27.48 kg/m    Physical Exam   No results found for any visits on 02/02/23.    The 10-year ASCVD risk score (Arnett DK, et al., 2019) is: 1.8%    Assessment & Plan:   Depoprovera  inection nurse visit. Admin   medroxyprogesterone  IM right deltoid. Patient tolerated injection well without complication.  Problem List Items Addressed This Visit   None Visit Diagnoses     Encounter for Depo-Provera contraception    -  Primary   Relevant Medications   medroxyPROGESTERone Acetate SUSY (Completed)       Return for Return between Jul 11th and July  25th  for nurse visit for depoprovera injection. Elizabeth Palau, LPN

## 2023-03-09 ENCOUNTER — Encounter: Payer: Self-pay | Admitting: Family Medicine

## 2023-03-09 ENCOUNTER — Other Ambulatory Visit (HOSPITAL_COMMUNITY)
Admission: RE | Admit: 2023-03-09 | Discharge: 2023-03-09 | Disposition: A | Discharge status: Home / Self Care | Payer: BC Managed Care – PPO | Source: Ambulatory Visit | Attending: Family Medicine | Admitting: Family Medicine

## 2023-03-09 ENCOUNTER — Ambulatory Visit (INDEPENDENT_AMBULATORY_CARE_PROVIDER_SITE_OTHER): Payer: BC Managed Care – PPO | Admitting: Family Medicine

## 2023-03-09 VITALS — BP 99/64 | HR 107 | Ht 64.0 in | Wt 160.0 lb

## 2023-03-09 DIAGNOSIS — Z124 Encounter for screening for malignant neoplasm of cervix: Secondary | ICD-10-CM | POA: Insufficient documentation

## 2023-03-09 DIAGNOSIS — Z0000 Encounter for general adult medical examination without abnormal findings: Secondary | ICD-10-CM

## 2023-03-09 NOTE — Progress Notes (Signed)
Complete physical exam  Patient: Brianna Mendoza   DOB: [birth year]   42 y.o. Female  MRN: [id number]  Subjective:    Chief Complaint  Patient presents with   Annual Exam    Brianna Mendoza is a 42 y.o. female who presents today for a complete physical exam. She reports consuming a general diet.  Walking 5 days per week.   She generally feels well.  She does have additional problems to discuss today.  Some sexually active with several months ago.   Most recent fall risk assessment:    03/09/2023    4:20 PM  Fall Risk   Falls in the past year? 0  Number falls in past yr: 0  Injury with Fall? 0  Risk for fall due to : No Fall Risks  Follow up Falls evaluation completed     Most recent depression screenings:    03/09/2023    4:21 PM 09/13/2022    3:59 PM  PHQ 2/9 Scores  PHQ - 2 Score 0 0        Patient Care Team: Agapito Games, MD as PCP - General (Family Medicine)   Outpatient Medications Prior to Visit  Medication Sig   albuterol (VENTOLIN HFA) 108 (90 Base) MCG/ACT inhaler INHALE 2 PUFFS BY MOUTH EVERY 6 HOURS AS NEEDED FOR WHEEZING   AMBULATORY NON FORMULARY MEDICATION Medication Name: Trace minerals   Ascorbic Acid (VITAMIN C) 100 MG tablet Take 100 mg by mouth daily.   ASHWAGANDHA PO Take 500 mg by mouth daily.   BLACK COHOSH PO Take 1 tablet by mouth daily.   chlorthalidone (HYGROTON) 25 MG tablet TAKE 1 TABLET (25 MG TOTAL) BY MOUTH DAILY.   cholecalciferol (VITAMIN D3) 25 MCG (1000 UNIT) tablet Take 1,000 Units by mouth daily.   cyclobenzaprine (FLEXERIL) 10 MG tablet TAKE 1/2 TO 1 TABLET (5-10 MG TOTAL) BY MOUTH EVERY DAY AT BEDTIME AS NEEDED FOR MUSCLE SPASM   fluticasone (FLONASE) 50 MCG/ACT nasal spray Place 2 sprays into both nostrils daily.   MAGNESIUM PO Take 1 tablet by mouth daily.   medroxyPROGESTERone (DEPO-PROVERA) 150 MG/ML injection Inject 150 mg into the muscle every 3 (three) months.   pantoprazole (PROTONIX) 40 MG tablet TAKE 1 TABLET  BY MOUTH EVERY DAY   potassium chloride (KLOR-CON) 10 MEQ tablet Take 1 tablet (10 mEq total) by mouth once for 1 dose.   Prenatal Vit-DSS-Fe Cbn-FA (PRENATAL AD PO) Take 1 capsule by mouth once a week.   vitamin E 1000 UNIT capsule Take 1,000 Units by mouth daily.   No facility-administered medications prior to visit.    ROS        Objective:     BP 99/64   Pulse (!) 107   Ht 5\' 4"  (1.626 m)   Wt 160 lb (72.6 kg)   SpO2 100%   BMI 27.46 kg/m    Physical Exam Vitals and nursing note reviewed.  Constitutional:      Appearance: Normal appearance. She is well-developed.  HENT:     Head: Normocephalic and atraumatic.     Right Ear: Tympanic membrane, ear canal and external ear normal.     Left Ear: Tympanic membrane, ear canal and external ear normal.     Nose: Nose normal.     Mouth/Throat:     Pharynx: Oropharynx is clear.  Eyes:     Conjunctiva/sclera: Conjunctivae normal.     Pupils: Pupils are equal, round, and reactive to light.  Neck:  Thyroid: No thyromegaly.  Cardiovascular:     Rate and Rhythm: Normal rate and regular rhythm.     Heart sounds: Normal heart sounds.  Pulmonary:     Effort: Pulmonary effort is normal.     Breath sounds: Normal breath sounds. No wheezing.  Abdominal:     General: Bowel sounds are normal.     Palpations: Abdomen is soft.  Musculoskeletal:     Cervical back: Neck supple.  Lymphadenopathy:     Cervical: No cervical adenopathy.  Skin:    General: Skin is warm and dry.     Coloration: Skin is not pale.  Neurological:     Mental Status: She is alert and oriented to person, place, and time.  Psychiatric:        Mood and Affect: Mood normal.        Behavior: Behavior normal.        Thought Content: Thought content normal.      No results found for any visits on 03/09/23.     Assessment & Plan:    Routine Health Maintenance and Physical Exam  Immunization History  Administered Date(s) Administered   Influenza,  Seasonal, Injecte, Preservative Fre 07/11/2016   Influenza,inj,Quad PF,6+ Mos 07/30/2015, 07/11/2016   Influenza-Unspecified 07/13/2015   Moderna Sars-Covid-2 Vaccination 05/16/2020   PPD Test 12/11/2017   Tdap 02/08/2015    Health Maintenance  Topic Date Due   PAP SMEAR-Modifier  01/30/2023   Hepatitis C Screening  09/01/2023 (Originally 04/25/1998)   COVID-19 Vaccine (2 - 2023-24 season) 09/17/2023 (Originally 06/10/2022)   Pneumococcal Vaccine 34-35 Years old (1 of 2 - PCV) 06/24/2029 (Originally 04/25/1986)   INFLUENZA VACCINE  05/11/2023   DTaP/Tdap/Td (2 - Td or Tdap) 02/07/2025   HIV Screening  Completed   HPV VACCINES  Aged Out    Discussed health benefits of physical activity, and encouraged her to engage in regular exercise appropriate for her age and condition.  Problem List Items Addressed This Visit   None Visit Diagnoses     Wellness examination    -  Primary   Relevant Orders   Lipid Panel w/reflex Direct LDL   COMPLETE METABOLIC PANEL WITH GFR   CBC   Screening for cervical cancer       Relevant Orders   Cytology - PAP     Keep up a regular exercise program and make sure you are eating a healthy diet Try to eat 4 servings of dairy a day, or if you are lactose intolerant take a calcium with vitamin D daily.  Your vaccines are up to date.   Return in about 1 year (around 03/08/2024) for Wellness Exam.     Nani Gasser, MD

## 2023-03-10 ENCOUNTER — Other Ambulatory Visit: Payer: Self-pay | Admitting: Family Medicine

## 2023-03-10 DIAGNOSIS — Z1231 Encounter for screening mammogram for malignant neoplasm of breast: Secondary | ICD-10-CM

## 2023-03-17 LAB — CYTOLOGY - PAP
Comment: NEGATIVE
Diagnosis: NEGATIVE
High risk HPV: NEGATIVE

## 2023-03-17 NOTE — Progress Notes (Signed)
Your Pap smear is normal. You are negative for HPV as well. Repeat pap smear in 5 years.

## 2023-03-29 DIAGNOSIS — Z1231 Encounter for screening mammogram for malignant neoplasm of breast: Secondary | ICD-10-CM

## 2023-04-01 ENCOUNTER — Other Ambulatory Visit: Payer: Self-pay | Admitting: Family Medicine

## 2023-04-01 DIAGNOSIS — I10 Essential (primary) hypertension: Secondary | ICD-10-CM

## 2023-04-18 ENCOUNTER — Encounter: Payer: Self-pay | Admitting: Family Medicine

## 2023-04-18 ENCOUNTER — Telehealth: Payer: Self-pay | Admitting: Family Medicine

## 2023-04-18 NOTE — Telephone Encounter (Signed)
Pt called.  Following up on refill on Chlorthalidone 25 mg. Pt  asked if we can call in this morning? down to 1 pill.

## 2023-04-19 NOTE — Telephone Encounter (Signed)
Pt stated that she does have this medication.

## 2023-04-20 ENCOUNTER — Ambulatory Visit (INDEPENDENT_AMBULATORY_CARE_PROVIDER_SITE_OTHER): Payer: BC Managed Care – PPO | Admitting: Family Medicine

## 2023-04-20 VITALS — BP 124/78 | HR 100 | Ht 64.0 in

## 2023-04-20 DIAGNOSIS — Z3042 Encounter for surveillance of injectable contraceptive: Secondary | ICD-10-CM | POA: Diagnosis not present

## 2023-04-20 MED ORDER — MEDROXYPROGESTERONE ACETATE 150 MG/ML IM SUSY
150.0000 mg | PREFILLED_SYRINGE | Freq: Once | INTRAMUSCULAR | Status: AC
Start: 2023-04-20 — End: 2023-04-20
  Administered 2023-04-20: 150 mg via INTRAMUSCULAR

## 2023-04-20 NOTE — Patient Instructions (Signed)
Return between Sept 26- Oct 10 for next Depo Provera  injection nurse visit

## 2023-04-20 NOTE — Progress Notes (Signed)
   Established Patient Office Visit  Subjective   Patient ID: Brianna Mendoza, female    DOB: 1980/12/05  Age: 42 y.o. MRN: [id number]  Chief Complaint  Patient presents with   injectable contraception     Depoprovera Nurse visit     HPI  Depo provera injection - nurse visit . Patient denies chest  pain, shortness of breath, headaches, mood changes or medication problems.   ROS    Objective:     BP 124/78   Pulse 100   Ht 5\' 4"  (1.626 m)   SpO2 99%   BMI 27.46 kg/m    Physical Exam   No results found for any visits on 04/20/23.    The 10-year ASCVD risk score (Arnett DK, et al., 2019) is: 2.9%    Assessment & Plan:  Cherylann Parr Admin depoprovera 150mg  IM Left deltoid . Patient tolerated injection well without complications. Documentation by Rogene Houston, LPN Problem List Items Addressed This Visit   None Visit Diagnoses     On Depo-Provera for contraception    -  Primary   Relevant Medications   medroxyPROGESTERone Acetate SUSY 150 mg (Completed)       Return for Return between Sept 26- Oct 10 for next Depo Provera  injection nurse visit .    Elizabeth Palau, LPN

## 2023-04-20 NOTE — Progress Notes (Signed)
Agree with documentation as above.   Mclean Moya, MD  

## 2023-05-09 ENCOUNTER — Other Ambulatory Visit: Payer: Self-pay | Admitting: Family Medicine

## 2023-05-09 DIAGNOSIS — M5030 Other cervical disc degeneration, unspecified cervical region: Secondary | ICD-10-CM

## 2023-07-06 ENCOUNTER — Ambulatory Visit: Payer: BC Managed Care – PPO

## 2023-07-06 VITALS — Ht 64.0 in | Wt 164.8 lb

## 2023-07-06 DIAGNOSIS — R062 Wheezing: Secondary | ICD-10-CM

## 2023-07-06 DIAGNOSIS — Z3042 Encounter for surveillance of injectable contraceptive: Secondary | ICD-10-CM

## 2023-07-06 MED ORDER — ALBUTEROL SULFATE HFA 108 (90 BASE) MCG/ACT IN AERS
INHALATION_SPRAY | RESPIRATORY_TRACT | 1 refills | Status: AC
Start: 2023-07-06 — End: ?

## 2023-07-06 MED ORDER — MEDROXYPROGESTERONE ACETATE 150 MG/ML IM SUSY
1.0000 mL | PREFILLED_SYRINGE | Freq: Once | INTRAMUSCULAR | Status: AC
Start: 2023-07-06 — End: 2023-07-06
  Administered 2023-07-06: 150 mg via INTRAMUSCULAR

## 2023-07-06 NOTE — Progress Notes (Signed)
HPI  Pt comes in for her depo injection, depo administered in pt RA per pt, tolerated well no complications.        Assessment and plan:  Pt can come back for next depo injection between 09/21/23-10/04/23

## 2023-07-31 ENCOUNTER — Other Ambulatory Visit: Payer: Self-pay | Admitting: Family Medicine

## 2023-07-31 DIAGNOSIS — Z1231 Encounter for screening mammogram for malignant neoplasm of breast: Secondary | ICD-10-CM

## 2023-08-03 ENCOUNTER — Ambulatory Visit: Payer: BC Managed Care – PPO | Admitting: Family Medicine

## 2023-08-03 ENCOUNTER — Encounter: Payer: Self-pay | Admitting: Family Medicine

## 2023-08-03 VITALS — BP 112/81 | HR 113 | Ht 64.0 in | Wt 167.0 lb

## 2023-08-03 DIAGNOSIS — R109 Unspecified abdominal pain: Secondary | ICD-10-CM | POA: Diagnosis not present

## 2023-08-03 MED ORDER — MELOXICAM 15 MG PO TABS: 15.0000 mg | ORAL_TABLET | Freq: Every day | ORAL | 2 refills | Status: DC

## 2023-08-03 NOTE — Progress Notes (Addendum)
   Acute Office Visit  Subjective:     Patient ID: Brianna Mendoza, female    DOB: [birth year], 42 y.o.   MRN: [id number]  Chief Complaint  Patient presents with   Flank Pain    HPI Patient is in today for Left side pain. Had sausage that was greasy last week and it made her feel uncomfortable.  Feels full and bloated on that side after eating.  DIet hasn't been great lately. Off her regular miralax.    She had a stressful day at work. [facility]. She went to check on them and they weren't breathing.   ROS      Objective:    BP 112/81   Pulse (!) 113   Ht 5\' 4"  (1.626 m)   Wt 167 lb (75.8 kg)   SpO2 99%   BMI 28.67 kg/m    Physical Exam Vitals and nursing note reviewed.  Constitutional:      Appearance: Normal appearance.  HENT:     Head: Normocephalic and atraumatic.  Eyes:     Conjunctiva/sclera: Conjunctivae normal.  Cardiovascular:     Rate and Rhythm: Normal rate and regular rhythm.  Pulmonary:     Effort: Pulmonary effort is normal.     Breath sounds: Normal breath sounds.  Abdominal:     General: Bowel sounds are normal. There is no distension.     Palpations: Abdomen is soft. There is no mass.     Tenderness: There is no abdominal tenderness.  Musculoskeletal:     Comments: Nontender over the lumbar spine. Nontender over the side or hip  Skin:    General: Skin is warm and dry.  Neurological:     Mental Status: She is alert.  Psychiatric:        Mood and Affect: Mood normal.     No results found for any visits on 08/03/23.      Assessment & Plan:   Problem List Items Addressed This Visit   None Visit Diagnoses     Left sided abdominal pain    -  Primary       Left-sided abdominal pain.  No mass and no tenderness today.  I think it is probably a little bit of gas and constipation we discussed getting back on her MiraLAX more routinely.  Will go ahead and refill the meloxicam as well I do think some component could be  musculoskeletal as well it could be radiating from her back.  Call if it continues to recur or not resolving.  She says it feels very bloated on that side after she eats.  Meds ordered this encounter  Medications   meloxicam (MOBIC) 15 MG tablet    Sig: Take 1 tablet (15 mg total) by mouth daily.    Dispense:  30 tablet    Refill:  2   I spent 20 minutes on the day of the encounter to include pre-visit record review, face-to-face time with the patient and post visit ordering of test.  No follow-ups on file.  Nani Gasser, MD

## 2023-08-21 ENCOUNTER — Ambulatory Visit
Admission: RE | Admit: 2023-08-21 | Discharge: 2023-08-21 | Disposition: A | Discharge status: Home / Self Care | Payer: BC Managed Care – PPO | Source: Ambulatory Visit

## 2023-08-21 DIAGNOSIS — Z1231 Encounter for screening mammogram for malignant neoplasm of breast: Secondary | ICD-10-CM

## 2023-08-22 NOTE — Progress Notes (Signed)
Hi Brianna Mendoza, your mammogram shows a questionable area in the right breast a little bit of asymmetry.  The imaging department will be contacting you to get you scheduled for diagnostic mammogram and ultrasound.

## 2023-08-23 ENCOUNTER — Other Ambulatory Visit: Payer: Self-pay | Admitting: Family Medicine

## 2023-08-23 DIAGNOSIS — N6489 Other specified disorders of breast: Secondary | ICD-10-CM

## 2023-08-24 ENCOUNTER — Ambulatory Visit: Payer: BC Managed Care – PPO

## 2023-08-24 ENCOUNTER — Ambulatory Visit
Admission: RE | Admit: 2023-08-24 | Discharge: 2023-08-24 | Disposition: A | Discharge status: Home / Self Care | Payer: BC Managed Care – PPO | Source: Ambulatory Visit | Attending: Family Medicine | Admitting: Family Medicine

## 2023-08-24 DIAGNOSIS — N6489 Other specified disorders of breast: Secondary | ICD-10-CM

## 2023-09-14 ENCOUNTER — Other Ambulatory Visit: Payer: Self-pay | Admitting: Family Medicine

## 2023-09-14 DIAGNOSIS — M5030 Other cervical disc degeneration, unspecified cervical region: Secondary | ICD-10-CM

## 2023-09-17 ENCOUNTER — Encounter: Payer: Self-pay | Admitting: Family Medicine

## 2023-09-19 NOTE — Telephone Encounter (Signed)
We can do a note for her but I would suspect they have a formal paper or paperwork that we have to complete.  Most schools do.  If she would get [place] that form we are happy to fill it out.

## 2023-09-20 ENCOUNTER — Encounter: Payer: Self-pay | Admitting: Family Medicine

## 2023-09-21 ENCOUNTER — Ambulatory Visit: Payer: BC Managed Care – PPO

## 2023-09-21 VITALS — BP 114/81 | HR 99 | Ht 64.0 in

## 2023-09-21 DIAGNOSIS — Z3042 Encounter for surveillance of injectable contraceptive: Secondary | ICD-10-CM | POA: Diagnosis not present

## 2023-09-21 MED ORDER — MEDROXYPROGESTERONE ACETATE 150 MG/ML IM SUSP
150.0000 mg | Freq: Once | INTRAMUSCULAR | Status: AC
  Administered 2023-09-21: 150 mg via INTRAMUSCULAR

## 2023-09-21 NOTE — Patient Instructions (Signed)
Return between Dec 07, 2023 - December 21, 2023 for next depo-provera injection.

## 2023-09-21 NOTE — Progress Notes (Signed)
   Established Patient Office Visit  Subjective   Patient ID: Brianna Mendoza, female    DOB: 1981-05-06  Age: 42 y.o. MRN: [id number]  Chief Complaint  Patient presents with   depo-provera injection    Depo-provera injection nurse visit.    HPI  Depo-propvera injection - nurse visit. Patient denies s/e or medication problems.   ROS    Objective:     BP 114/81 (BP Location: Left Arm, Patient Position: Sitting, Cuff Size: Normal)   Pulse 99   Ht 5\' 4"  (1.626 m)   SpO2 100%   BMI 28.67 kg/m    Physical Exam   No results found for any visits on 09/21/23.    The 10-year ASCVD risk score (Arnett DK, et al., 2019) is: 2.1%    Assessment & Plan:  Depo-provera injection nurse visit. Patient tolerated injection well without complications. Patient will return between Dec 07, 2023 and December 21, 2023 for next depo-provera injection  Problem List Items Addressed This Visit   None   No follow-ups on file.    Elizabeth Palau, LPN

## 2023-09-22 NOTE — Telephone Encounter (Signed)
Form completed and placed in Tonya B's basket.  It looks like second page should be completed by the patient before she turns it in.

## 2023-09-26 ENCOUNTER — Encounter: Payer: Self-pay | Admitting: Family Medicine

## 2023-10-02 NOTE — Telephone Encounter (Signed)
Pt picked up these forms last week.

## 2023-10-11 ENCOUNTER — Other Ambulatory Visit: Payer: Self-pay | Admitting: Family Medicine

## 2023-10-11 DIAGNOSIS — I10 Essential (primary) hypertension: Secondary | ICD-10-CM

## 2023-12-07 ENCOUNTER — Ambulatory Visit (INDEPENDENT_AMBULATORY_CARE_PROVIDER_SITE_OTHER): Payer: BC Managed Care – PPO

## 2023-12-07 VITALS — BP 107/79 | HR 95 | Ht 64.0 in

## 2023-12-07 DIAGNOSIS — Z3042 Encounter for surveillance of injectable contraceptive: Secondary | ICD-10-CM | POA: Diagnosis not present

## 2023-12-07 MED ORDER — MEDROXYPROGESTERONE ACETATE 150 MG/ML IM SUSP
150.0000 mg | Freq: Once | INTRAMUSCULAR | Status: AC
  Administered 2023-12-07: 150 mg via INTRAMUSCULAR

## 2023-12-07 NOTE — Patient Instructions (Signed)
 Return between May 15th and May 29th for next Depo-provera injection as nurse visit.

## 2023-12-07 NOTE — Progress Notes (Signed)
   Established Patient Office Visit  Subjective   Patient ID: Lavergne Hiltunen, female    DOB: [birth year]  Age: 43 y.o. MRN: [id number]  Chief Complaint  Patient presents with   depo provera injection    Depo- Provera injection nurse visit.     HPI  Depo-Provera injection nurse visit.   ROS    Objective:     BP 107/79   Pulse 95   Ht 5\' 4"  (1.626 m)   SpO2 100%   BMI 28.67 kg/m    Physical Exam   No results found for any visits on 12/07/23.    The 10-year ASCVD risk score (Arnett DK, et al., 2019) is: 1.5%    Assessment & Plan:  Depo-Provera injection nurse visit. Admin 150mg  Left deltoid IM. Patient tolerated injection well without complications. Return between May 15th and May 29th for next Depo-provera injection as nurse visit.  Problem List Items Addressed This Visit   None Visit Diagnoses       Surveillance for Depo-Provera contraception    -  Primary   Relevant Medications   medroxyPROGESTERone (DEPO-PROVERA) injection 150 mg (Completed) (Start on 12/07/2023  4:00 PM)       Return for return between May 15th and May 29th for next depoprovera injection. Elizabeth Palau, LPN

## 2024-02-22 ENCOUNTER — Ambulatory Visit (INDEPENDENT_AMBULATORY_CARE_PROVIDER_SITE_OTHER): Payer: BC Managed Care – PPO

## 2024-02-22 VITALS — BP 105/80 | HR 106 | Temp 98.8°F | Resp 16 | Ht 64.0 in | Wt 163.0 lb

## 2024-02-22 DIAGNOSIS — Z3042 Encounter for surveillance of injectable contraceptive: Secondary | ICD-10-CM | POA: Diagnosis not present

## 2024-02-22 MED ORDER — MEDROXYPROGESTERONE ACETATE 150 MG/ML IM SUSP
150.0000 mg | INTRAMUSCULAR | Status: AC
  Administered 2024-02-22: 150 mg via INTRAMUSCULAR

## 2024-02-22 NOTE — Progress Notes (Signed)
 Pt is here for a depo provera  injection.Denies chest pain, shortness of breath, headaches, mood  changes or problems with medications Injection was tolerated well by the patient. (See MAR for injection details)  in Left Deltoid. Pt  advised  to schedule  next injection in 12 weeks

## 2024-04-01 ENCOUNTER — Ambulatory Visit: Payer: Self-pay

## 2024-04-01 NOTE — Telephone Encounter (Signed)
 1st attempt, no answer. Left voicemail for patient to call nurse triage line.   Pt says she had a flare up in her left knee/leg Friday and this has happened before. Wanted to come in today only to see Dr. Alvan, but there were no appts left. Offered tomorrow, but pt declined and says she will go to the ER. Wanted a message to be sent to her pcp. Feels it could possibly be a blood clot. Please contact pt.

## 2024-04-01 NOTE — Telephone Encounter (Signed)
 Pt states that she is in the ED, was wanting appt today for her L knee pain. Pt states that she will call back and schedule a f/u after ED eval.   FYI Only or Action Required?: FYI only for provider.  Patient was last seen in primary care on 08/03/2023 by Alvan Dorothyann BIRCH, MD. Called Nurse Triage reporting Leg Pain. Symptoms began several days ago.

## 2024-04-02 ENCOUNTER — Encounter: Payer: Self-pay | Admitting: Family Medicine

## 2024-04-02 ENCOUNTER — Ambulatory Visit: Admitting: Family Medicine

## 2024-04-02 VITALS — BP 131/82 | HR 95 | Ht 64.0 in | Wt 164.7 lb

## 2024-04-02 DIAGNOSIS — M5030 Other cervical disc degeneration, unspecified cervical region: Secondary | ICD-10-CM | POA: Diagnosis not present

## 2024-04-02 DIAGNOSIS — M797 Fibromyalgia: Secondary | ICD-10-CM

## 2024-04-02 MED ORDER — VENLAFAXINE HCL ER 37.5 MG PO CP24: 37.5000 mg | ORAL_CAPSULE | Freq: Every day | ORAL | 1 refills | Status: DC

## 2024-04-02 MED ORDER — MELOXICAM 15 MG PO TABS: 15.0000 mg | ORAL_TABLET | Freq: Every day | ORAL | 1 refills | Status: DC

## 2024-04-02 MED ORDER — CYCLOBENZAPRINE HCL 10 MG PO TABS: ORAL_TABLET | ORAL | 2 refills | Status: DC

## 2024-04-02 MED ORDER — CYCLOBENZAPRINE HCL 10 MG PO TABS: ORAL_TABLET | ORAL | 2 refills | Status: AC

## 2024-04-02 NOTE — Progress Notes (Signed)
 Pt reports that she was seen at [facility] ED. She reports that she had a Fibro flare and L leg swelling.   Her labs came back and it was r/o that she did not have a DVT.   Pt stated that she is experiencing a lot of joint pain all over and she is really tired. She is unsure if this is because she is Premenopausal or what. She was taking some supplements to help her with the sxs of menopause such as hot flashes, ect and it was working for a little while but stopped because she didn't feel that it was effective.   She stated that the pain in her leg and ankles is usually at night and during the day it moves into her L shoulder during the daytime.

## 2024-04-02 NOTE — Progress Notes (Signed)
 Established Patient Office Visit  Subjective  Patient ID: Brianna Mendoza, female    DOB: [birth year]  Age: 43 y.o. MRN: [id number]  Chief Complaint  Patient presents with   Hospitalization Follow-up    Pt was seen at [facility] ED for Fibromyalgia and Leg swelling.    HPI  She is here today for recent fibromyalgia flare.  Work has been very stressful really since January they have new management and so thin things have changed.  She just feels like she has been dealing with a little bit more chronic inflammation and last week she woke up with pain in her leg which then started to move into her ankle and then up into her shoulder.  She was worried and ended up going to the [facility] emergency department to be evaluated they did rule out a blood clot and felt like it was probably her from her fibromyalgia flaring.  She is off her meloxicam  for several weeks because the pharmacy did not have it in stock she just uses the Flexeril  occasionally and has been using that a little bit she took a half a tab last night.  She feels like the tops of her shoulders are also little swollen.  ED visit was yesterday.  She has also been dealing more with some of her perimenopausal symptoms.  She has been having more hot flashes sleep disruption and mood irritability.  She frequently wakes up around 3:30 in the morning with hot flashes and then has difficulty going back to sleep.  And then has to get up at 5 for work.    ROS    Objective:     BP 131/82   Pulse 95   Ht 5' 4 (1.626 m)   Wt 164 lb 11.2 oz (74.7 kg)   SpO2 97%   BMI 28.27 kg/m    Physical Exam Vitals and nursing note reviewed.  Constitutional:      Appearance: Normal appearance.  HENT:     Head: Normocephalic and atraumatic.   Eyes:     Conjunctiva/sclera: Conjunctivae normal.    Cardiovascular:     Rate and Rhythm: Normal rate and regular rhythm.  Pulmonary:     Effort: Pulmonary effort is normal.     Breath sounds: Normal  breath sounds.   Skin:    General: Skin is warm and dry.   Neurological:     Mental Status: She is alert.   Psychiatric:        Mood and Affect: Mood normal.      No results found for any visits on 04/02/24.    The 10-year ASCVD risk score (Arnett DK, et al., 2019) is: 4.1%    Assessment & Plan:   Problem List Items Addressed This Visit       Musculoskeletal and Integument   DDD (degenerative disc disease), cervical   Restart meloxicam .  The pharmacy was just out of the overmedication.      Relevant Medications   cyclobenzaprine  (FLEXERIL ) 10 MG tablet   meloxicam  (MOBIC ) 15 MG tablet     Other   Fibromyalgia - Primary   We did discuss some options she would like to restart her venlafaxine  which she had taken previously but had been off of it for a while because she was really doing quite well I think it will help with the combination of her fibro, her hot flashes as well as her mood irritability we will just start with a low dose and then adjust upward  depending on how she is doing.  Will also try to send the meloxicam  to [facility] to see if they have it in stock.  She also needs a  refills Qsymia on the Flexeril .  We did discuss writing her out for the rest of this week to just be able to rest.  Pick up her medications and start them again.  She was already off her work on Friday.  So she will just miss today tomorrow and Thursday.  I think she would also benefit from us  completing some paperwork for intermittent leave for her fibro flares overall she does really well but I do think covering her for the occasional flare would be helpful so that she can come in for appointments when needed      Relevant Medications   cyclobenzaprine  (FLEXERIL ) 10 MG tablet   meloxicam  (MOBIC ) 15 MG tablet   venlafaxine  XR (EFFEXOR  XR) 37.5 MG 24 hr capsule    Return in about 2 weeks (around 04/16/2024) for fibro.    Dorothyann Byars, MD

## 2024-04-02 NOTE — Assessment & Plan Note (Signed)
 Restart meloxicam .  The pharmacy was just out of the overmedication.

## 2024-04-02 NOTE — Assessment & Plan Note (Addendum)
 We did discuss some options she would like to restart her venlafaxine  which she had taken previously but had been off of it for a while because she was really doing quite well I think it will help with the combination of her fibro, her hot flashes as well as her mood irritability we will just start with a low dose and then adjust upward depending on how she is doing.  Will also try to send the meloxicam  to [facility] to see if they have it in stock.  She also needs a  refills Qsymia on the Flexeril .  We did discuss writing her out for the rest of this week to just be able to rest.  Pick up her medications and start them again.  She was already off her work on Friday.  So she will just miss today tomorrow and Thursday.  I think she would also benefit from us  completing some paperwork for intermittent leave for her fibro flares overall she does really well but I do think covering her for the occasional flare would be helpful so that she can come in for appointments when needed

## 2024-04-03 ENCOUNTER — Encounter: Payer: Self-pay | Admitting: Family Medicine

## 2024-04-04 ENCOUNTER — Encounter: Payer: Self-pay | Admitting: Family Medicine

## 2024-04-05 ENCOUNTER — Encounter: Payer: Self-pay | Admitting: Family Medicine

## 2024-04-05 NOTE — Telephone Encounter (Signed)
Forms completed and placed in Tonya B basket 

## 2024-04-08 ENCOUNTER — Telehealth: Payer: Self-pay | Admitting: *Deleted

## 2024-04-08 NOTE — Telephone Encounter (Signed)
 Forms given to Brianna Mendoza.   Please call pt and inform her that her forms have been completed and that she will need to pay the fee for the forms to be released and/or faxed.

## 2024-05-09 ENCOUNTER — Ambulatory Visit

## 2024-05-09 VITALS — BP 105/75 | HR 93 | Ht 64.0 in

## 2024-05-09 DIAGNOSIS — Z3042 Encounter for surveillance of injectable contraceptive: Secondary | ICD-10-CM

## 2024-05-09 MED ORDER — MEDROXYPROGESTERONE ACETATE 150 MG/ML IM SUSP
150.0000 mg | Freq: Once | INTRAMUSCULAR | Status: AC
  Administered 2024-05-09: 150 mg via INTRAMUSCULAR

## 2024-05-09 NOTE — Progress Notes (Signed)
   Established Patient Office Visit  Subjective   Patient ID: Brianna Mendoza, female    DOB: Feb 22, 1981  Age: 43 y.o. MRN: [id number]  Chief Complaint  Patient presents with   survellience for contraceptive management    Depo-Provera  injection nurse visit.     HPI Surveillance for contraceptive management-  Depo-Provera  injection.   ROS    Objective:     BP 105/75   Pulse 93   Ht 5' 4 (1.626 m)   SpO2 99%   BMI 28.27 kg/m    Physical Exam   No results found for any visits on 05/09/24.    The 10-year ASCVD risk score (Arnett DK, et al., 2019) is: 1.5%    Assessment & Plan:  Admin Depo-Provera  injection as nurse visit.  Admin 150mg  IM Right Deltoid. First attempted injection syringe needle would not dispense. Changed needles and dispensed without issue.  Patient tolerated injections well without complications. Return between Jul 25, 2024 and Aug 08, 2024.  Problem List Items Addressed This Visit   None Visit Diagnoses       Surveillance for Depo-Provera  contraception    -  Primary   Relevant Medications   medroxyPROGESTERone  (DEPO-PROVERA ) injection 150 mg (Completed) (Start on 05/09/2024  4:15 PM)       Return for Return between Jul 25, 2024 and Oct 30,2025. Brianna Suzen SHAUNNA Alpheus, LPN

## 2024-05-09 NOTE — Patient Instructions (Signed)
 Return between Jul 25, 2024 and Oct 30,2025.

## 2024-05-17 ENCOUNTER — Other Ambulatory Visit: Payer: Self-pay | Admitting: Family Medicine

## 2024-05-17 DIAGNOSIS — I10 Essential (primary) hypertension: Secondary | ICD-10-CM

## 2024-06-04 ENCOUNTER — Ambulatory Visit: Admitting: Family Medicine

## 2024-06-04 ENCOUNTER — Encounter: Payer: Self-pay | Admitting: Family Medicine

## 2024-06-04 VITALS — BP 121/87 | HR 106 | Ht 64.0 in | Wt 164.0 lb

## 2024-06-04 DIAGNOSIS — M797 Fibromyalgia: Secondary | ICD-10-CM

## 2024-06-04 DIAGNOSIS — K921 Melena: Secondary | ICD-10-CM | POA: Diagnosis not present

## 2024-06-04 DIAGNOSIS — Z720 Tobacco use: Secondary | ICD-10-CM | POA: Insufficient documentation

## 2024-06-04 DIAGNOSIS — R1032 Left lower quadrant pain: Secondary | ICD-10-CM | POA: Diagnosis not present

## 2024-06-04 DIAGNOSIS — I10 Essential (primary) hypertension: Secondary | ICD-10-CM

## 2024-06-04 DIAGNOSIS — E876 Hypokalemia: Secondary | ICD-10-CM | POA: Diagnosis not present

## 2024-06-04 MED ORDER — POTASSIUM CHLORIDE ER 10 MEQ PO TBCR: 10.0000 meq | EXTENDED_RELEASE_TABLET | ORAL | 1 refills | Status: AC

## 2024-06-04 NOTE — Assessment & Plan Note (Signed)
 BP looks great today.  She is actually been taking her chlorthalidone  every other day because she says when she tries to split it it crumbles.  Her potassium was low at urgent care visit about a month ago and wanted to know if she should continue to take her potassium supplement which she also takes every other day.  She will need a new refill.

## 2024-06-04 NOTE — Progress Notes (Signed)
 Established Patient Office Visit  Subjective  Patient ID: Brianna Mendoza, female    DOB: January 05, 1981  Age: 43 y.o. MRN: [id number]  Chief Complaint  Patient presents with   Medical Management of Chronic Issues    HPI  C/o of left sided abdominal pain that radiates into her back. Worse when she eats processed foods. Occ blood with wiping after BMs. She stopped drinking coffe. Using mirlax in her tea.    Discussed the use of AI scribe software for clinical note transcription with the patient, who gave verbal consent to proceed.  History of Present Illness Emsley Prew is a 43 year old with fibromyalgia and irritable bowel syndrome who presents with gastrointestinal symptoms and blood in stool.  Gastrointestinal symptoms - Blood in stool, particularly when constipated - Blood in stool is not excessive - Bowel movements vary from diarrhea to solid stools, with occasional sticky or unusual appearances - Symptoms associated with stress and dietary triggers, including processed foods such as pizza and fried chicken - Certain foods, such as spinach, can precipitate diarrhea - Dietary modifications include increased salads and reduced gluten intake to manage symptoms - No known family history of major gastrointestinal issues - Symptoms have been present for an extended period  Abdominal pain - Left-sided abdominal pain, cramping and aching in quality - Pain worsens with consumption of certain foods, especially processed foods - Pain is relieved by use of a TENS unit  Constipation management - Uses Miralax and tea to manage constipation, with some relief - Cautious with medication use due to gastrointestinal symptoms  Sleep disturbances and vasomotor symptoms - Frequent awakening at 3 AM - Experiences hot flashes and night sweats  Tobacco cessation - Bought  Nicorette gum to quit smoking, 2mg  dose. Hasn't started it yet.   Antihypertensive medication use - Takes high blood  pressure medication every other day - Takes a potassium pill      ROS    Objective:     BP 121/87   Pulse (!) 106   Ht 5' 4 (1.626 m)   Wt 164 lb (74.4 kg)   SpO2 96%   BMI 28.15 kg/m    Physical Exam Vitals and nursing note reviewed.  Constitutional:      Appearance: Normal appearance.  HENT:     Head: Normocephalic and atraumatic.  Eyes:     Conjunctiva/sclera: Conjunctivae normal.  Cardiovascular:     Rate and Rhythm: Normal rate and regular rhythm.  Pulmonary:     Effort: Pulmonary effort is normal.     Breath sounds: Normal breath sounds.  Abdominal:     General: Bowel sounds are normal.     Palpations: Abdomen is soft.     Tenderness: There is no abdominal tenderness.  Skin:    General: Skin is warm and dry.  Neurological:     Mental Status: She is alert.  Psychiatric:        Mood and Affect: Mood normal.      No results found for any visits on 06/04/24.    The ASCVD Risk score (Arnett DK, et al., 2019) failed to calculate for the following reasons:   Cannot find a previous HDL lab   Cannot find a previous total cholesterol lab    Assessment & Plan:   Problem List Items Addressed This Visit       Cardiovascular and Mediastinum   Essential hypertension, benign - Primary   BP looks great today.  She is actually been taking her  chlorthalidone  every other day because she says when she tries to split it it crumbles.  Her potassium was low at urgent care visit about a month ago and wanted to know if she should continue to take her potassium supplement which she also takes every other day.  She will need a new refill.        Other   Tobacco abuse   Fibromyalgia   Other Visit Diagnoses       Bloody stool       Relevant Orders   Saccharomyces cerevisiae antibodies, IgG and IgA   Glia (IgA/G) + tTG IgA     LLQ pain       Relevant Orders   Saccharomyces cerevisiae antibodies, IgG and IgA   Glia (IgA/G) + tTG IgA     Hypokalemia        Relevant Medications   potassium chloride  (KLOR-CON ) 10 MEQ tablet      Assessment and Plan Assessment & Plan Irritable bowel syndrome with rectal bleeding and possible gluten sensitivity Intermittent abdominal pain and rectal bleeding possibly linked to dietary triggers. Differential includes Crohn's disease, ulcerative colitis, and diverticulitis. Gluten sensitivity considered but unconfirmed. - Order blood tests for Crohn's disease, ulcerative colitis, and gluten sensitivity. - Advise dietary modifications to monitor gluten sensitivity. - Discuss potential gastroenterology referral if symptoms persist or worsen.  Fibromyalgia Chronic pain condition with episodes of pain and cramping, manageable but changing over time.  Essential hypertension Hypertension managed with medication, monitoring blood pressure and medication schedule closely.  Nicotine dependence, in remission with nicotine replacement therapy Nicotine dependence in remission with Nicorette gum, using 2 mg gum for smoking cessation. - Continue Nicorette gum as per instructions for smoking cessation.  LLQ pain with bloody stools.  - We discussed possible GI referral.  She is a little hesitant to do that yet.  We did discuss doing some additional labs just to evaluate for gluten enteropathy and Crohn's disease she is okay with doing that we will check a CBC as well. - Next year she will be 45 which is typically when we start colon cancer screening.  If labs are unrevealing then I am still going to probably recommend GI workup.    No follow-ups on file.    Dorothyann Byars, MD

## 2024-06-07 ENCOUNTER — Ambulatory Visit: Payer: Self-pay | Admitting: Family Medicine

## 2024-06-07 DIAGNOSIS — I10 Essential (primary) hypertension: Secondary | ICD-10-CM

## 2024-06-07 DIAGNOSIS — K921 Melena: Secondary | ICD-10-CM

## 2024-06-07 LAB — SACCHAROMYCES CEREVISIAE ANTIBODIES, IGG AND IGA
Saccharomyces cerevisiae, IgA: <20 U (ref 0.0–24.9)
Saccharomyces cerevisiae, IgG: <20 U (ref 0.0–24.9)

## 2024-06-07 LAB — GLIA (IGA/G) + TTG IGA
Antigliadin Abs, IgA: 4 U (ref 0–19)
Gliadin IgG: 1 U (ref 0–19)
Transglutaminase IgA: <2 U/mL (ref 0–3)

## 2024-06-07 NOTE — Progress Notes (Signed)
 Hi Brianna Mendoza, the labs are negative for Crohn's disease and negative for gluten enteropathy. You can still try a gluten free diet just to see if you feel better.

## 2024-06-12 MED ORDER — CHLORTHALIDONE 25 MG PO TABS: 25.0000 mg | ORAL_TABLET | Freq: Every day | ORAL | 1 refills | Status: DC

## 2024-06-12 NOTE — Addendum Note (Signed)
 Addended by: BONNY JON DEL on: 06/12/2024 08:42 AM   Modules accepted: Orders

## 2024-07-17 LAB — HM COLONOSCOPY

## 2024-07-25 ENCOUNTER — Ambulatory Visit (INDEPENDENT_AMBULATORY_CARE_PROVIDER_SITE_OTHER)

## 2024-07-25 VITALS — BP 116/81 | HR 83 | Temp 98.3°F | Ht 64.0 in | Wt 162.0 lb

## 2024-07-25 DIAGNOSIS — Z3042 Encounter for surveillance of injectable contraceptive: Secondary | ICD-10-CM | POA: Diagnosis not present

## 2024-07-25 MED ORDER — MEDROXYPROGESTERONE ACETATE 150 MG/ML IM SUSY
150.0000 mg | PREFILLED_SYRINGE | Freq: Once | INTRAMUSCULAR | Status: AC
  Administered 2024-07-25: 150 mg via INTRAMUSCULAR

## 2024-07-25 NOTE — Progress Notes (Signed)
 Patient is here for a Depo Provera injection. Denies chest pain, shortness of breath, headaches, mood changes or problems with medication.   Injection administered in the Right Deltoid. Patient tolerated injection well without complications. Patient advised to schedule next injection in 12 weeks.

## 2024-09-11 ENCOUNTER — Other Ambulatory Visit: Payer: Self-pay | Admitting: Family Medicine

## 2024-09-11 DIAGNOSIS — I10 Essential (primary) hypertension: Secondary | ICD-10-CM

## 2024-10-11 ENCOUNTER — Ambulatory Visit

## 2024-10-11 VITALS — BP 110/74 | HR 95 | Ht 64.0 in

## 2024-10-11 DIAGNOSIS — Z3042 Encounter for surveillance of injectable contraceptive: Secondary | ICD-10-CM

## 2024-10-11 MED ORDER — MEDROXYPROGESTERONE ACETATE 150 MG/ML IM SUSY
PREFILLED_SYRINGE | Freq: Once | INTRAMUSCULAR | Status: AC
  Administered 2024-10-11: 150 mg via INTRAMUSCULAR

## 2024-10-11 NOTE — Patient Instructions (Signed)
 Return between Mar 20 - January 10, 2025 for next Depo-Provera  injection as nurse visit.

## 2024-10-11 NOTE — Progress Notes (Signed)
" ° °  Established Patient Office Visit  Subjective   Patient ID: Brianna Mendoza, female    DOB: [birth year]  Age: 44 y.o. MRN: [id number]  Chief Complaint  Patient presents with   Depo-provera  contraception injection    Nurse visit for Depo-Provera  contraception injection as nurse visit.     HPI  Depo-Provera  contraception injection- nurse visit. Patient's last injectio given 07/25/2024. Patient denies any problems with medication use.   ROS    Objective:     BP 110/74 (BP Location: Left Arm, Patient Position: Sitting, Cuff Size: Normal)   Pulse 95   Ht 5' 4 (1.626 m)   SpO2 100%   BMI 27.81 kg/m    Physical Exam   No results found for any visits on 10/11/24.    The ASCVD Risk score (Arnett DK, et al., 2019) failed to calculate for the following reasons:   Cannot find a previous HDL lab   Cannot find a previous total cholesterol lab   * - Cholesterol units were assumed    Assessment & Plan:  Depo-Provera  injection . Admin 150mg  IM Left deltoid. Patient tolerated injection well without complications. Patient will return between Dec 27, 2024 and January 10, 2025.  Problem List Items Addressed This Visit   None Visit Diagnoses       On Depo-Provera  for contraception    -  Primary   Relevant Medications   medroxyPROGESTERone  Acetate SUSY (Completed) (Start on 10/11/2024  4:15 PM)       Return for Return between Mar 20 - January 10, 2025 for next Depo-Provera  injection as nurse visit. SABRA Suzen SHAUNNA Alpheus, LPN  "

## 2024-12-27 ENCOUNTER — Ambulatory Visit
